# Patient Record
Sex: Male | Born: 1975 | Race: White | Hispanic: No | Marital: Single | Smoking: Current every day smoker
Health system: Southern US, Community
[De-identification: ages and names within clinical notes are randomized; demographics above are authoritative.]

## PROBLEM LIST (undated history)

## (undated) DIAGNOSIS — G8929 Other chronic pain: Secondary | ICD-10-CM

## (undated) DIAGNOSIS — R109 Unspecified abdominal pain: Secondary | ICD-10-CM

## (undated) DIAGNOSIS — N451 Epididymitis: Secondary | ICD-10-CM

## (undated) DIAGNOSIS — M549 Dorsalgia, unspecified: Secondary | ICD-10-CM

## (undated) DIAGNOSIS — E559 Vitamin D deficiency, unspecified: Secondary | ICD-10-CM

## (undated) HISTORY — PX: OTHER SURGICAL HISTORY: SHX169

---

## 1998-03-19 ENCOUNTER — Emergency Department (HOSPITAL_COMMUNITY): Admission: EM | Admit: 1998-03-19 | Discharge: 1998-03-19 | Payer: Self-pay | Admitting: Emergency Medicine

## 2003-10-01 ENCOUNTER — Emergency Department (HOSPITAL_COMMUNITY): Admission: EM | Admit: 2003-10-01 | Discharge: 2003-10-01 | Payer: Self-pay | Admitting: *Deleted

## 2004-02-20 ENCOUNTER — Emergency Department (HOSPITAL_COMMUNITY): Admission: EM | Admit: 2004-02-20 | Discharge: 2004-02-20 | Payer: Self-pay | Admitting: Emergency Medicine

## 2004-04-12 ENCOUNTER — Emergency Department (HOSPITAL_COMMUNITY): Admission: EM | Admit: 2004-04-12 | Discharge: 2004-04-12 | Payer: Self-pay | Admitting: Emergency Medicine

## 2006-02-14 ENCOUNTER — Emergency Department (HOSPITAL_COMMUNITY): Admission: EM | Admit: 2006-02-14 | Discharge: 2006-02-14 | Payer: Self-pay | Admitting: Emergency Medicine

## 2006-02-26 ENCOUNTER — Encounter (HOSPITAL_COMMUNITY): Admission: RE | Admit: 2006-02-26 | Discharge: 2006-03-28 | Payer: Self-pay | Admitting: Orthopaedic Surgery

## 2006-03-04 ENCOUNTER — Emergency Department (HOSPITAL_COMMUNITY): Admission: EM | Admit: 2006-03-04 | Discharge: 2006-03-04 | Payer: Self-pay | Admitting: Emergency Medicine

## 2006-03-26 ENCOUNTER — Emergency Department (HOSPITAL_COMMUNITY): Admission: EM | Admit: 2006-03-26 | Discharge: 2006-03-26 | Payer: Self-pay | Admitting: Emergency Medicine

## 2008-09-25 ENCOUNTER — Emergency Department (HOSPITAL_COMMUNITY): Admission: EM | Admit: 2008-09-25 | Discharge: 2008-09-26 | Payer: Self-pay | Admitting: Emergency Medicine

## 2009-10-27 ENCOUNTER — Emergency Department (HOSPITAL_COMMUNITY): Admission: EM | Admit: 2009-10-27 | Discharge: 2009-10-27 | Payer: Self-pay | Admitting: Emergency Medicine

## 2009-11-29 IMAGING — CR DG CHEST 2V
2 series · 2 of 2 positions shown · non-contrast
Comparison: None.

CLINICAL DATA: Rash.  Cough.  Congestion.

CHEST - 2 VIEW

[view not recorded (1 of 2)]
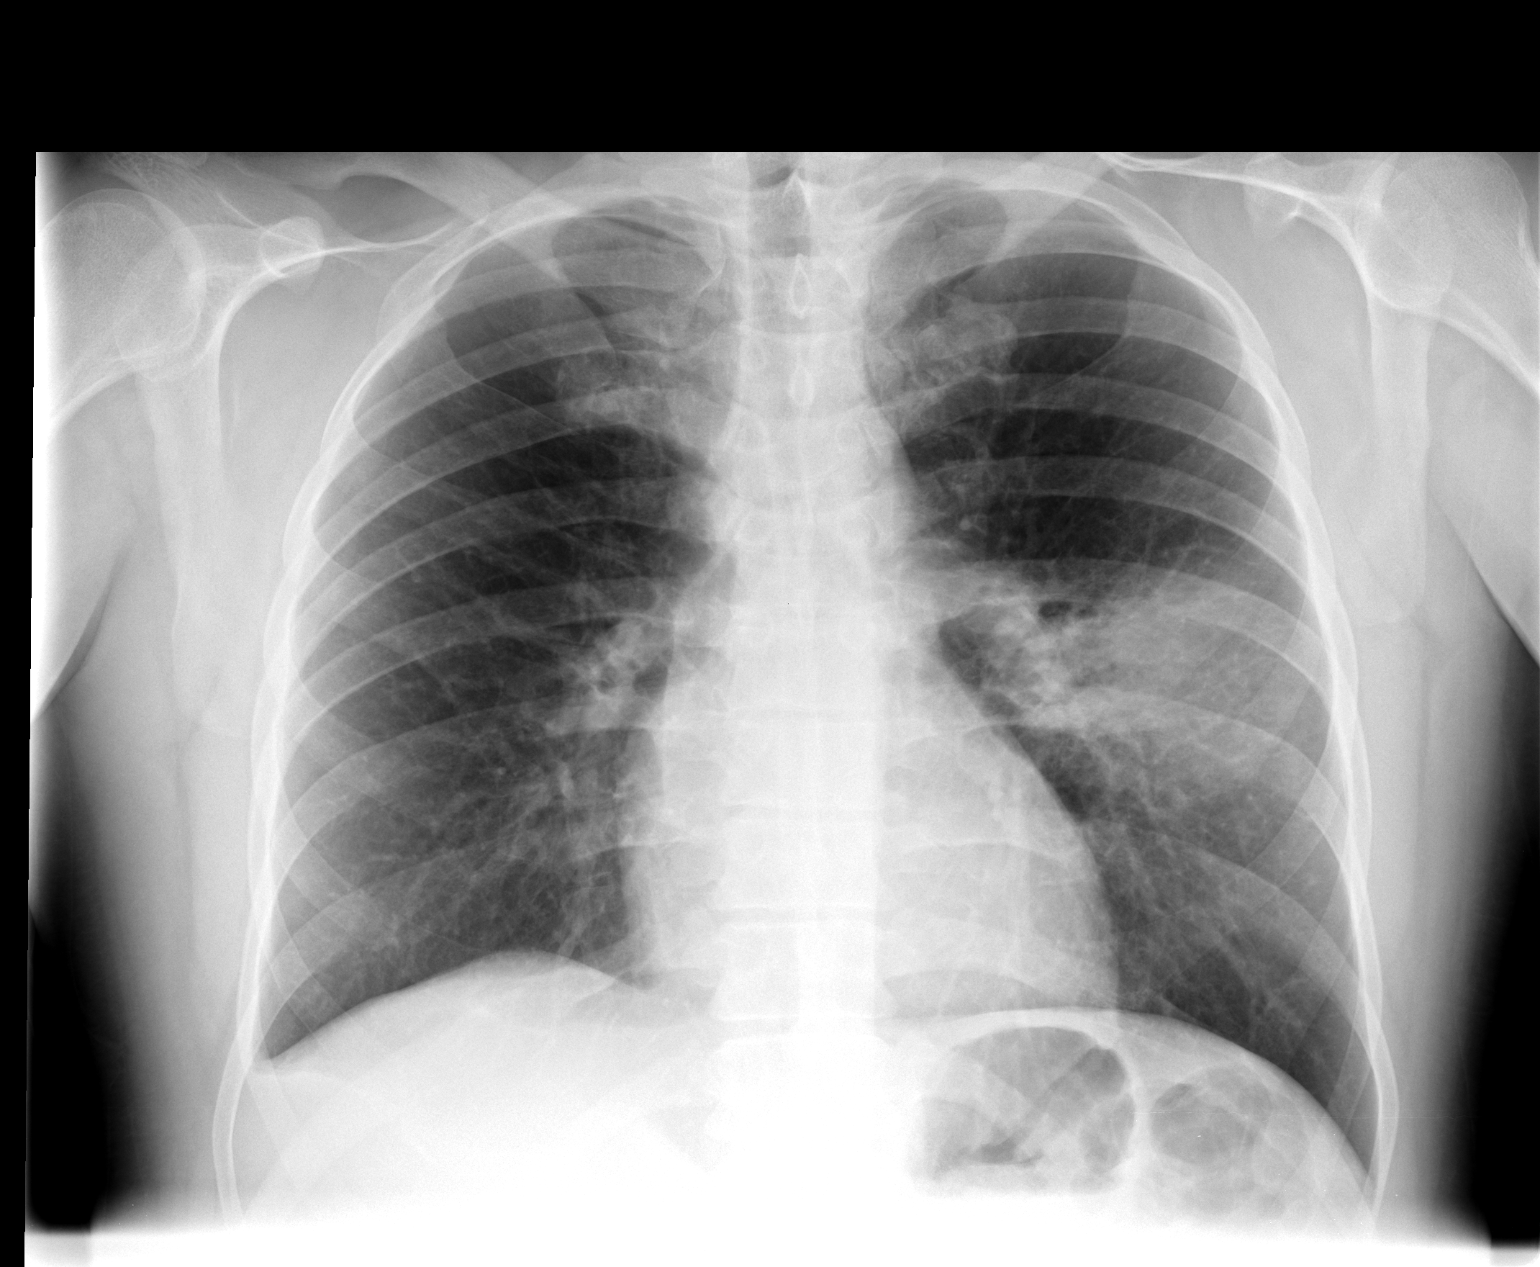

[view not recorded (2 of 2)]
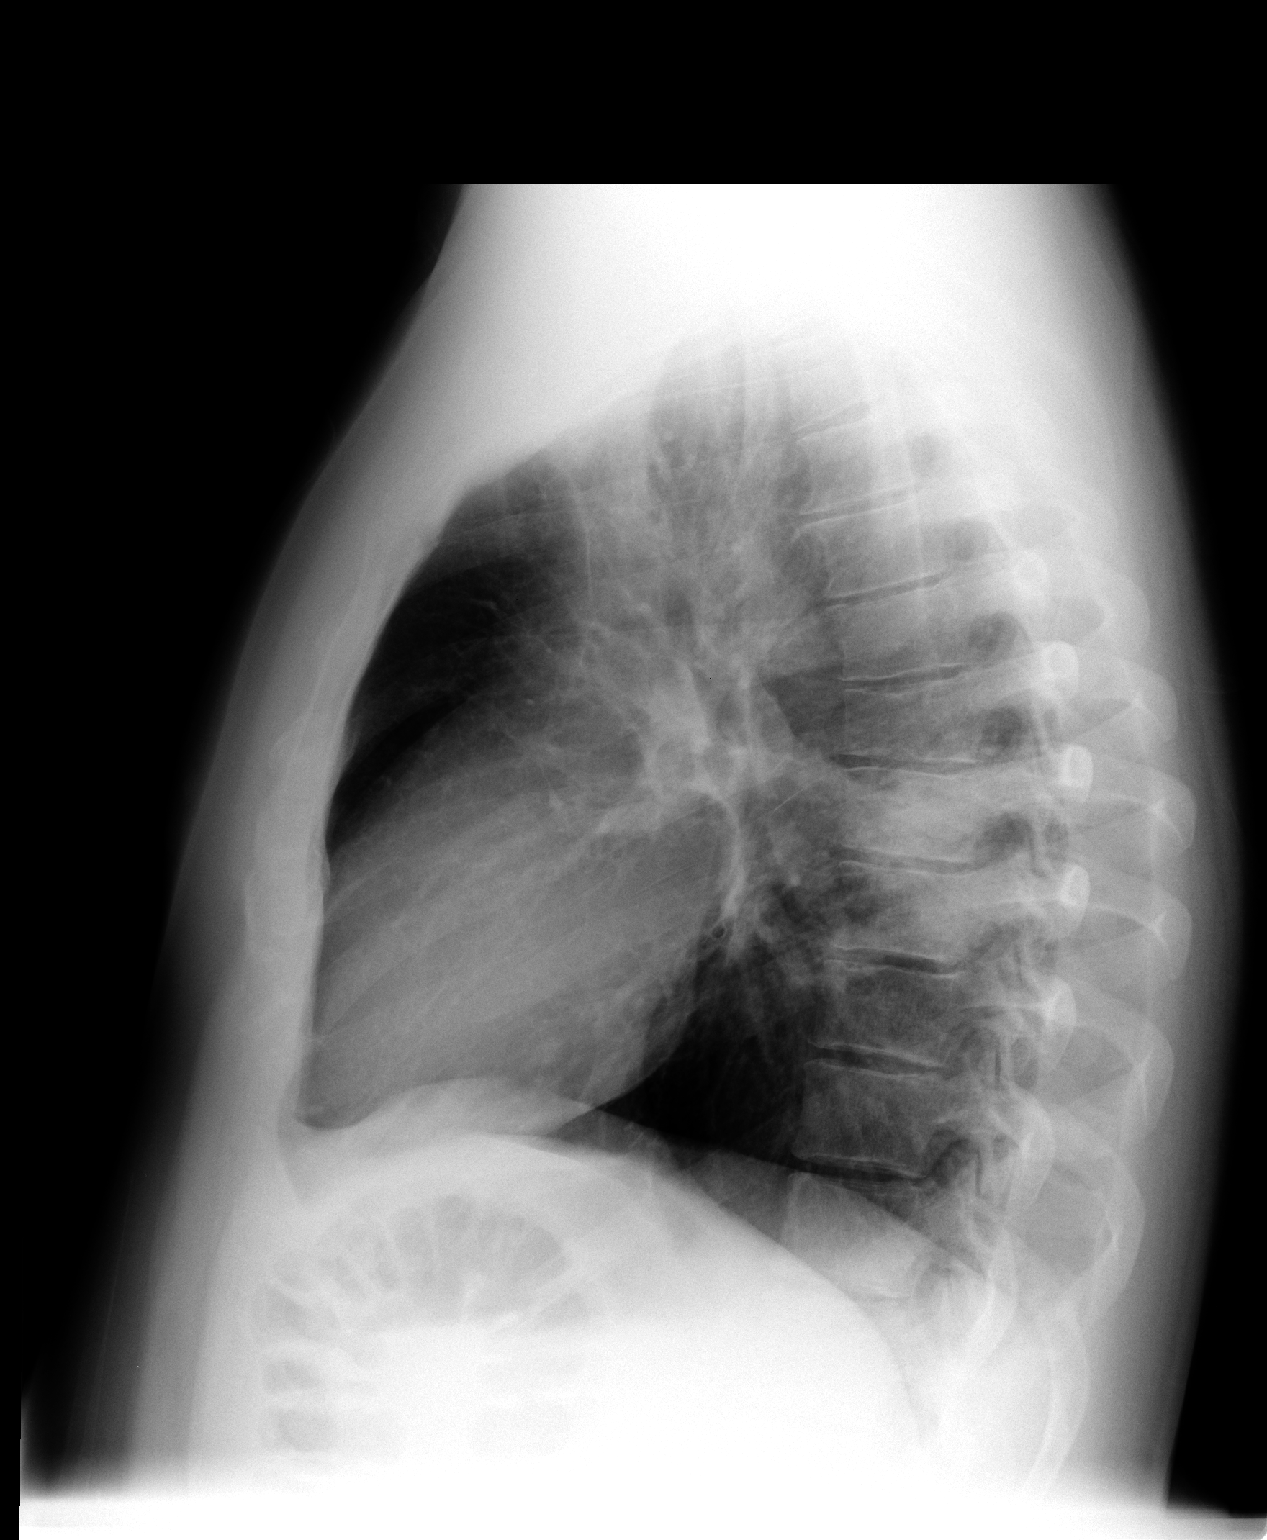

[2 of 2 positions shown; findings below may reference images not displayed]

FINDINGS: Superior segment left lower lobe pneumonia is present.
Cardiopericardial silhouette appears within normal limits.  No
pleural effusion.  No plain film evidence of adenopathy. Follow-up
to ensure radiographic clearing recommended.  Clearing is usually
observed at 4-6 weeks.
IMPRESSION: Superior segment left lower lobe pneumonia.

## 2009-11-30 ENCOUNTER — Emergency Department (HOSPITAL_COMMUNITY): Admission: EM | Admit: 2009-11-30 | Discharge: 2009-11-30 | Payer: Self-pay | Admitting: Emergency Medicine

## 2010-01-10 ENCOUNTER — Emergency Department (HOSPITAL_COMMUNITY): Admission: EM | Admit: 2010-01-10 | Discharge: 2009-11-29 | Payer: Self-pay | Admitting: Emergency Medicine

## 2010-01-30 ENCOUNTER — Emergency Department (HOSPITAL_COMMUNITY)
Admission: EM | Admit: 2010-01-30 | Discharge: 2010-01-30 | Payer: Self-pay | Source: Home / Self Care | Admitting: Emergency Medicine

## 2010-12-04 ENCOUNTER — Emergency Department (HOSPITAL_COMMUNITY): Payer: Self-pay

## 2010-12-04 ENCOUNTER — Encounter: Payer: Self-pay | Admitting: *Deleted

## 2010-12-04 ENCOUNTER — Emergency Department (HOSPITAL_COMMUNITY)
Admission: EM | Admit: 2010-12-04 | Discharge: 2010-12-04 | Disposition: A | Discharge status: Home / Self Care | Payer: Self-pay | Attending: Emergency Medicine | Admitting: Emergency Medicine

## 2010-12-04 DIAGNOSIS — Z87891 Personal history of nicotine dependence: Secondary | ICD-10-CM | POA: Insufficient documentation

## 2010-12-04 DIAGNOSIS — J40 Bronchitis, not specified as acute or chronic: Secondary | ICD-10-CM | POA: Insufficient documentation

## 2010-12-04 DIAGNOSIS — M549 Dorsalgia, unspecified: Secondary | ICD-10-CM | POA: Insufficient documentation

## 2010-12-04 DIAGNOSIS — J329 Chronic sinusitis, unspecified: Secondary | ICD-10-CM | POA: Insufficient documentation

## 2010-12-04 DIAGNOSIS — W11XXXA Fall on and from ladder, initial encounter: Secondary | ICD-10-CM | POA: Insufficient documentation

## 2010-12-04 MED ORDER — PREDNISONE 50 MG PO TABS: 50.0000 mg | ORAL_TABLET | Freq: Every day | ORAL | Status: AC

## 2010-12-04 MED ORDER — HYDROCOD POLST-CHLORPHEN POLST 10-8 MG/5ML PO LQCR: 5.0000 mL | Freq: Two times a day (BID) | ORAL | Status: DC

## 2010-12-04 MED ORDER — AZITHROMYCIN 250 MG PO TABS: 250.0000 mg | ORAL_TABLET | Freq: Every day | ORAL | Status: AC

## 2010-12-04 MED ORDER — ACETAMINOPHEN 500 MG PO TABS
1000.0000 mg | ORAL_TABLET | Freq: Once | ORAL | Status: AC
  Administered 2010-12-04: 1000 mg via ORAL
  Filled 2010-12-04: qty 2

## 2010-12-04 NOTE — ED Notes (Signed)
Reports productive cough, congestion.  Also reporting occasional SOB, and back pain.

## 2010-12-04 NOTE — ED Provider Notes (Signed)
Scribed for Donnetta Hutching, MD, the patient was seen in room APA08/APA08. This chart was scribed by [employer]. The patient's care started at 21:17  CSN: [id number] Arrival date & time: 12/04/2010  8:53 PM   First MD Initiated Contact with Patient 12/04/10 2117      Chief Complaint  Patient presents with  . Cough   HPI FARAAZ WOLIN is a 35 y.o. male who presents to the Emergency Department complaining of Cough. Patient reports feeling like he has pneumonia. He also reports that he was coming down a ladder when he fell and hit his back. Reports he has since felt like "electricity is going through his hands". Reports tingling sensation when he coughs. Patient is a former smoker    History reviewed. No pertinent past medical history.  History reviewed. No pertinent past surgical history.  History reviewed. No pertinent family history.  History  Substance Use Topics  . Smoking status: Former Games developer  . Smokeless tobacco: Not on file  . Alcohol Use: No      Review of Systems  Musculoskeletal: Positive for back pain.  All other systems reviewed and are negative.    Allergies  Sulfa antibiotics and Tramadol  Home Medications   Current Outpatient Rx  Name Route Sig Dispense Refill  . DM-PHENYLEPHRINE-ACETAMINOPHEN 10-5-325 MG/15ML PO LIQD Oral Take 10 mLs by mouth at bedtime as needed. For cold symptoms     . GUAIFENESIN 400 MG PO TABS Oral Take 400 mg by mouth every 4 (four) hours.      Marland Kitchen PHENYLEPHRINE-DM-GG 5-10-200 MG/5ML PO LIQD Oral Take 5-10 mLs by mouth as needed. For cold symptoms       BP 123/67  Pulse 70  Temp(Src) 98.6 F (37 C) (Oral)  Resp 19  SpO2 99%  Physical Exam  Nursing note and vitals reviewed. Constitutional: He appears well-developed and well-nourished. No distress.  HENT:  Head: Normocephalic and atraumatic.  Right Ear: External ear normal.  Left Ear: External ear normal.       Minimal maxillary sinus tenderness  Eyes: Conjunctivae are  normal. Right eye exhibits no discharge. Left eye exhibits no discharge. No scleral icterus.  Neck: Neck supple. No tracheal deviation present.  Cardiovascular: Normal rate, regular rhythm and intact distal pulses.   Pulmonary/Chest: Effort normal and breath sounds normal. No stridor. No respiratory distress. He has no wheezes. He has no rales.  Abdominal: Soft. Bowel sounds are normal. He exhibits no distension. There is no tenderness. There is no rebound and no guarding.  Musculoskeletal: He exhibits tenderness (Tenderness at T10). He exhibits no edema.  Neurological: He is alert. He has normal strength. No sensory deficit. Cranial nerve deficit:  no gross defecits noted. He exhibits normal muscle tone. He displays no seizure activity. Coordination normal.  Skin: Skin is warm and dry. No rash noted.  Psychiatric: He has a normal mood and affect.    ED Course  Procedures   DIAGNOSTIC STUDIES: Oxygen Saturation is 99% on room air, normal by my interpretation.    COORDINATION OF CARE: 21:35 - EDP examined patient and discussed treatment plan and options with him. Patient to be put on some Prednisone, antibiotics and prescription cough syrup. Patient is aware of treatment plan and is in agreement.   Dg Chest 2 View  12/04/2010  *RADIOLOGY REPORT*  Clinical Data: Cough and wheeze for several days.  CHEST - 2 VIEW  Comparison: 11/29/2009  Findings: Previously demonstrated left lower lung pneumonia has resolved. The heart  size and pulmonary vascularity are normal. The lungs appear clear and expanded without focal air space disease or consolidation. No blunting of the costophrenic angles. Degenerative changes in the spine.  IMPRESSION: No evidence of active pulmonary disease.  Original Report Authenticated By: Marlon Pel, M.D.    MDM  Patient is nontoxic. Lungs are clear. There is minimal maxillary sinus tenderness. Describes shooting pain down the arms and legs while coughing. Normal  neuro exam. Rx antibiotic, prednisone, cough syrup.   I personally performed the services described in this documentation, which was scribed in my presence. The recorded information has been reviewed and considered.      Donnetta Hutching, MD 12/04/10 2238

## 2010-12-04 NOTE — ED Notes (Signed)
Attempted to place pt in room.  No response from waiting area when called.

## 2010-12-04 NOTE — ED Notes (Signed)
Patient to room 8 from waiting room. Placed in gown. States he has been having a productive cough x 3 days. Sputum yellowish brown. States he was going down a ladder two days ago, got caught in a tight space in between a piece of wood and hurt his middle back. Ever since occurrence, when patient coughs, he has "jolts of pain" that goes down bilateral arms and at times bilateral legs. Pain only occurs when coughing.

## 2010-12-04 NOTE — ED Notes (Signed)
Patient back to room from radiology. Denies any needs at this time. Watching tv. Call bell within reach. Will continue to monitor.

## 2010-12-04 NOTE — ED Notes (Signed)
Into room to see patient. In no distress. Equal chest rise and fall, regular. States he is having 6/10 headache and would like something for the pain. Denies any other needs. Call bell within reach. MD aware. Will continue to monitor.

## 2011-01-09 ENCOUNTER — Emergency Department (HOSPITAL_COMMUNITY): Payer: Self-pay

## 2011-01-09 ENCOUNTER — Encounter (HOSPITAL_COMMUNITY): Payer: Self-pay | Admitting: *Deleted

## 2011-01-09 ENCOUNTER — Emergency Department (HOSPITAL_COMMUNITY)
Admission: EM | Admit: 2011-01-09 | Discharge: 2011-01-09 | Disposition: A | Discharge status: Home / Self Care | Payer: Self-pay | Attending: Emergency Medicine | Admitting: Emergency Medicine

## 2011-01-09 DIAGNOSIS — Y92009 Unspecified place in unspecified non-institutional (private) residence as the place of occurrence of the external cause: Secondary | ICD-10-CM | POA: Insufficient documentation

## 2011-01-09 DIAGNOSIS — M5431 Sciatica, right side: Secondary | ICD-10-CM

## 2011-01-09 DIAGNOSIS — S60940A Unspecified superficial injury of right index finger, initial encounter: Secondary | ICD-10-CM

## 2011-01-09 DIAGNOSIS — M5430 Sciatica, unspecified side: Secondary | ICD-10-CM | POA: Insufficient documentation

## 2011-01-09 DIAGNOSIS — R350 Frequency of micturition: Secondary | ICD-10-CM | POA: Insufficient documentation

## 2011-01-09 DIAGNOSIS — R209 Unspecified disturbances of skin sensation: Secondary | ICD-10-CM | POA: Insufficient documentation

## 2011-01-09 DIAGNOSIS — R5381 Other malaise: Secondary | ICD-10-CM | POA: Insufficient documentation

## 2011-01-09 DIAGNOSIS — W230XXA Caught, crushed, jammed, or pinched between moving objects, initial encounter: Secondary | ICD-10-CM | POA: Insufficient documentation

## 2011-01-09 DIAGNOSIS — Z23 Encounter for immunization: Secondary | ICD-10-CM | POA: Insufficient documentation

## 2011-01-09 DIAGNOSIS — Z87891 Personal history of nicotine dependence: Secondary | ICD-10-CM | POA: Insufficient documentation

## 2011-01-09 DIAGNOSIS — S60949A Unspecified superficial injury of unspecified finger, initial encounter: Secondary | ICD-10-CM | POA: Insufficient documentation

## 2011-01-09 HISTORY — DX: Dorsalgia, unspecified: M54.9

## 2011-01-09 HISTORY — DX: Other chronic pain: G89.29

## 2011-01-09 MED ORDER — CEPHALEXIN 500 MG PO CAPS
500.0000 mg | ORAL_CAPSULE | Freq: Once | ORAL | Status: AC
  Administered 2011-01-09: 500 mg via ORAL
  Filled 2011-01-09: qty 1

## 2011-01-09 MED ORDER — TETANUS-DIPHTH-ACELL PERTUSSIS 5-2.5-18.5 LF-MCG/0.5 IM SUSP
0.5000 mL | Freq: Once | INTRAMUSCULAR | Status: AC
  Administered 2011-01-09: 0.5 mL via INTRAMUSCULAR
  Filled 2011-01-09 (×2): qty 0.5

## 2011-01-09 MED ORDER — CEPHALEXIN 500 MG PO CAPS: 500.0000 mg | ORAL_CAPSULE | Freq: Four times a day (QID) | ORAL | Status: AC

## 2011-01-09 MED ORDER — HYDROCODONE-ACETAMINOPHEN 5-325 MG PO TABS
1.0000 | ORAL_TABLET | Freq: Once | ORAL | Status: AC
  Administered 2011-01-09: 1 via ORAL
  Filled 2011-01-09: qty 1

## 2011-01-09 MED ORDER — OXYCODONE-ACETAMINOPHEN 5-325 MG PO TABS: 1.0000 | ORAL_TABLET | ORAL | Status: AC | PRN

## 2011-01-09 NOTE — ED Provider Notes (Signed)
I have personally seen and examined the patient.  I have discussed the plan of care with the resident.  I have reviewed the documentation on PMH/FH/Soc. History.  I have reviewed the documentation of the resident and agree except, no clonus on my exam.  Pt is ambulatory.  Equal reflexes in bilateral LE.  Normal rectal tone.  No saddle anesthesia.  Denies recent urinary incontinence/fecal incontinence Doubt cauda equina at this time.  Needs spine followup   Joya Gaskins, MD 01/09/11 2031

## 2011-01-09 NOTE — ED Notes (Signed)
Pt c/o right leg pain; pt states he has a pinched nerve; pt also c/o left forefinger pain after getting it caught in a grinder; pt is missing part of finger nail; bleeding controlled at this time

## 2011-01-09 NOTE — ED Notes (Signed)
Pt reports pain in his right leg and hips for about 2 months that feels like a tingling and numbness. Came to ED today with left index finger injury. States he was grinding some metal for work and got his finger caught in the grinder. Bleeding is controlled at this time. No acute distress noted.

## 2011-01-09 NOTE — ED Provider Notes (Signed)
History     CSN: [id number] Arrival date & time: 01/09/2011  5:30 PM   First MD Initiated Contact with Patient 01/09/11 1740      Chief Complaint  Patient presents with  . Leg Pain    right leg  . Hand Pain    left forefinger   HPI Pt is a 35 year old male who presents today after catching his left index finger in a grinder.  He reports doing this at work today.  He cleaned it afterwards and has been applying pressure with a cold cloth since then.  Minimal bleeding.  Has a fair amount of pain and throbbing in the finger but no swelling and is able to move it.  Pt also reports worsening back and leg pain, especially in his right leg.  He has had back pain for years after having broken his back, but it has worsened in the last two months.  He is experiencing significant weakness in the right leg, shooting pains down this leg, and intermittent bladder incontinence.  He has numbness in the right leg and has a hard time walking, both due to pain and due to weakness.  Past Medical History  Diagnosis Date  . Chronic back pain     History reviewed. No pertinent past surgical history.  History reviewed. No pertinent family history.  History  Substance Use Topics  . Smoking status: Former Games developer  . Smokeless tobacco: Not on file  . Alcohol Use: No      Review of Systems  HENT: Negative.   Eyes: Negative.   Respiratory: Negative.   Cardiovascular: Negative.   Gastrointestinal: Negative.   Genitourinary: Positive for frequency and difficulty urinating. Negative for dysuria, flank pain and enuresis.  Musculoskeletal: Positive for gait problem. Negative for myalgias, back pain, joint swelling and arthralgias.       Finger per HPI  Skin: Negative.   Neurological: Positive for weakness and numbness. Negative for dizziness, light-headedness and headaches.  Hematological: Negative.     Allergies  Sulfa antibiotics and Tramadol  Home Medications   Current Outpatient Rx  Name  Route Sig Dispense Refill  . HYDROCOD POLST-CHLORPHEN POLST 10-8 MG/5ML PO LQCR Oral Take 5 mLs by mouth every 12 (twelve) hours. 120 mL 0  . DM-PHENYLEPHRINE-ACETAMINOPHEN 10-5-325 MG/15ML PO LIQD Oral Take 10 mLs by mouth at bedtime as needed. For cold symptoms     . GUAIFENESIN 400 MG PO TABS Oral Take 400 mg by mouth every 4 (four) hours.      Marland Kitchen PHENYLEPHRINE-DM-GG 5-10-200 MG/5ML PO LIQD Oral Take 5-10 mLs by mouth as needed. For cold symptoms       BP 113/55  Pulse 70  Temp(Src) 98.2 F (36.8 C) (Oral)  Resp 18  Ht 5\' 10"  (1.778 m)  Wt 190 lb (86.183 kg)  BMI 27.26 kg/m2  SpO2 98%  Physical Exam  Constitutional: He is oriented to person, place, and time. He appears well-developed and well-nourished.  HENT:  Head: Normocephalic and atraumatic.  Eyes: Conjunctivae and EOM are normal.  Neck: Normal range of motion. Neck supple.  Cardiovascular: Normal rate, regular rhythm and normal heart sounds.   Pulmonary/Chest: Effort normal and breath sounds normal.  Abdominal: Soft. He exhibits no distension. There is no tenderness.  Musculoskeletal:       Pt has left index finger injury.  Grinder has partialy removed the left nail with some injury to the nail bed but no bone or tendon is visible.  Has full  ROM, with pain, and has no active bleeding.  Small laceration over the left index DIP joint, also with no bleeding, no bone showing, and no tendon visible.  Pt has 4+ strength in the right lower extremity. Is able to stand on heels and has some ability to rise on toes bilaterally.  Neurological: He is alert and oriented to person, place, and time. No cranial nerve deficit.       Pt has 2-3 beat clonus in both LE, increased pateller reflexes bilaterally, and numbness on the anterior shin and top of foot on the right.  Skin: Skin is warm and dry.    ED Course  Procedures Finger irrigated with sterile water and cleaned with betadine.  Pt tolerated well.  Labs Reviewed - No data to  display No results found.   No diagnosis found.    MDM  More thorough exam with patient undressed demonstrates no clonus, brisk but symmetric bilateral lower extremity reflexes, and good rectal tone.  Will give numbers to [facility] and PMNR.  Spoke with Dr. Hilda Lias about OP f/u.  Agrees with plan.  Send out with 1 week keflex plus some pain medications.        Majel Homer, MD 01/09/11 2001

## 2011-01-22 ENCOUNTER — Encounter (HOSPITAL_COMMUNITY): Payer: Self-pay | Admitting: *Deleted

## 2011-01-22 ENCOUNTER — Emergency Department (HOSPITAL_COMMUNITY)
Admission: EM | Admit: 2011-01-22 | Discharge: 2011-01-22 | Disposition: A | Discharge status: Home / Self Care | Payer: Self-pay | Attending: Emergency Medicine | Admitting: Emergency Medicine

## 2011-01-22 ENCOUNTER — Emergency Department (HOSPITAL_COMMUNITY): Payer: Self-pay

## 2011-01-22 DIAGNOSIS — N3090 Cystitis, unspecified without hematuria: Secondary | ICD-10-CM | POA: Insufficient documentation

## 2011-01-22 DIAGNOSIS — D72829 Elevated white blood cell count, unspecified: Secondary | ICD-10-CM | POA: Insufficient documentation

## 2011-01-22 DIAGNOSIS — R319 Hematuria, unspecified: Secondary | ICD-10-CM | POA: Insufficient documentation

## 2011-01-22 DIAGNOSIS — Z87891 Personal history of nicotine dependence: Secondary | ICD-10-CM | POA: Insufficient documentation

## 2011-01-22 LAB — CBC
HCT: 46.2 % (ref 39.0–52.0)
Hemoglobin: 16 g/dL (ref 13.0–17.0)
MCH: 32.3 pg (ref 26.0–34.0)
MCV: 93.1 fL (ref 78.0–100.0)
RBC: 4.96 MIL/uL (ref 4.22–5.81)

## 2011-01-22 LAB — COMPREHENSIVE METABOLIC PANEL
BUN: 10 mg/dL (ref 6–23)
CO2: 28 mEq/L (ref 19–32)
Calcium: 10.3 mg/dL (ref 8.4–10.5)
Creatinine, Ser: 0.83 mg/dL (ref 0.50–1.35)
GFR calc Af Amer: >90 mL/min (ref >90)
GFR calc non Af Amer: >90 mL/min (ref >90)
Glucose, Bld: 89 mg/dL (ref 70–99)

## 2011-01-22 LAB — URINALYSIS, ROUTINE W REFLEX MICROSCOPIC
Bilirubin Urine: NEGATIVE
Glucose, UA: NEGATIVE mg/dL
Ketones, ur: NEGATIVE mg/dL
Leukocytes, UA: NEGATIVE
Nitrite: NEGATIVE
Specific Gravity, Urine: 1.025 (ref 1.005–1.030)
pH: 6 (ref 5.0–8.0)

## 2011-01-22 LAB — URINE MICROSCOPIC-ADD ON

## 2011-01-22 MED ORDER — HYDROCODONE-ACETAMINOPHEN 5-325 MG PO TABS: 1.0000 | ORAL_TABLET | Freq: Three times a day (TID) | ORAL | Status: AC | PRN

## 2011-01-22 MED ORDER — HYDROMORPHONE HCL PF 1 MG/ML IJ SOLN
1.0000 mg | Freq: Once | INTRAMUSCULAR | Status: AC
  Administered 2011-01-22: 1 mg via INTRAVENOUS
  Filled 2011-01-22: qty 1

## 2011-01-22 MED ORDER — CIPROFLOXACIN HCL 500 MG PO TABS: 500.0000 mg | ORAL_TABLET | Freq: Two times a day (BID) | ORAL | Status: AC

## 2011-01-22 MED ORDER — ONDANSETRON HCL 4 MG/2ML IJ SOLN
4.0000 mg | Freq: Once | INTRAMUSCULAR | Status: AC
  Administered 2011-01-22: 4 mg via INTRAVENOUS
  Filled 2011-01-22: qty 2

## 2011-01-22 MED ORDER — SODIUM CHLORIDE 0.9 % IV BOLUS (SEPSIS)
1000.0000 mL | Freq: Once | INTRAVENOUS | Status: AC
  Administered 2011-01-22: 1000 mL via INTRAVENOUS

## 2011-01-22 MED ORDER — CIPROFLOXACIN IN D5W 400 MG/200ML IV SOLN
400.0000 mg | Freq: Once | INTRAVENOUS | Status: AC
  Administered 2011-01-22: 400 mg via INTRAVENOUS
  Filled 2011-01-22: qty 200

## 2011-01-22 NOTE — ED Notes (Signed)
Pt states sever left flank pain began 30 min PTA.  ALso states possible hernia to left side/abdomen.

## 2011-01-22 NOTE — ED Notes (Signed)
Pt reports pain in flank area, "i thought it was a hernia, it has hurt me for longer than a week, it hurt too bad to strain so my wife has been giving me enemas every two days so I could have a bowel movement". Pt given a urine cup and instructed to provide urine sample. Awaiting MD assessment at this time. Also complains of nausea, not emesis noted at this time.

## 2011-01-22 NOTE — ED Notes (Signed)
MD at bedside. 

## 2011-01-22 NOTE — ED Provider Notes (Signed)
History     CSN: [id number] Arrival date & time: 01/22/2011  6:43 PM   First MD Initiated Contact with Patient 01/22/11 1913      Chief Complaint  Patient presents with  . Flank Pain    (Consider location/radiation/quality/duration/timing/severity/associated sxs/prior treatment) HPI The patient presents with left sided abdominal and flank pain. He notes that the pain began insidiously about one hour prior to presentation. Since onset has been Burning/Tightness sensation about the left flank and left abdomen. The pain is otherwise nonradiating. The patient notes that he is having a difficult time urinating, though no dysuria. No clear alleviating or exacerbating factors  Past Medical History  Diagnosis Date  . Chronic back pain    UTI in the distant past   History reviewed. No pertinent past surgical history.  No family history on file.  History  Substance Use Topics  . Smoking status: Former Games developer  . Smokeless tobacco: Not on file  . Alcohol Use: No      Review of Systems  Constitutional:       Per HPI, otherwise negative  HENT:       Per HPI, otherwise negative  Eyes: Negative.   Respiratory:       Per HPI, otherwise negative  Cardiovascular:       Per HPI, otherwise negative  Gastrointestinal: Negative for vomiting.  Genitourinary: Negative.   Musculoskeletal:       Per HPI, otherwise negative  Skin: Negative.   Neurological: Negative for syncope.    Allergies  Sulfa antibiotics and Tramadol  Home Medications   Current Outpatient Rx  Name Route Sig Dispense Refill  . CEPHALEXIN 500 MG PO CAPS Oral Take 500 mg by mouth 4 (four) times daily.      . OXYCODONE-ACETAMINOPHEN 5-325 MG PO TABS Oral Take 1 tablet by mouth every 4 (four) hours as needed. For pain       BP 142/49  Pulse 80  Temp(Src) 98.2 F (36.8 C) (Oral)  Resp 20  Ht 5\' 10"  (1.778 m)  Wt 190 lb (86.183 kg)  BMI 27.26 kg/m2  SpO2 100%  Physical Exam  Nursing note and vitals  reviewed. Constitutional: He is oriented to person, place, and time. He appears well-developed and well-nourished.  HENT:  Head: Normocephalic and atraumatic.  Eyes: Conjunctivae and EOM are normal.  Cardiovascular: Normal rate and regular rhythm.   Pulmonary/Chest: No stridor. No respiratory distress.  Abdominal: Soft. There is no hepatosplenomegaly. There is tenderness. There is CVA tenderness.       No appreciable fascia defect in the left lower quadrant or periumbilical regions  Musculoskeletal: He exhibits no edema.  Neurological: He is alert and oriented to person, place, and time.  Skin: Skin is warm and dry.  Psychiatric: He has a normal mood and affect.    ED Course  Procedures (including critical care time)  Labs Reviewed  URINALYSIS, ROUTINE W REFLEX MICROSCOPIC - Abnormal; Notable for the following:    Hgb urine dipstick TRACE (*)    All other components within normal limits  URINE MICROSCOPIC-ADD ON  CBC  COMPREHENSIVE METABOLIC PANEL   No results found.   No diagnosis found.  9:34 PM The patient notes significant decrease in his pain.  Results d/w the patient. Who notes that he has access to PMD, and endorses understanding of return precautions.  MDM  This generally well 35 year old male presents with new left flank pain. On exam the patient is a comfortable though in no distress.  The patient's vital signs are stable. Patient's labs are notable for mild leukocytosis, minimal hematuria. Given this second finding the patient had a CT abdomen pelvis to rule out kidney stone. CT did not demonstrate acute pathology. Given the aforementioned findings the patient was treated as though he has a urinary tract infection, noting that the evidence for this is mild, however the patient does have a history of prior UTI, which is supportive of this diagnosis. The patient's pain is more likely due to muscle strain, given the patient's endorsement of significant amount of physical  labor. Exposer return precautions, and care instructions were provided to the patient. He is stable for discharge.       Gerhard Munch, MD 01/22/11 2137

## 2011-01-22 NOTE — ED Notes (Signed)
Discharge instructions reviewed with pt, questions answered. Pt verbalized understanding.  

## 2011-01-22 NOTE — ED Notes (Signed)
Pt back from radiology. Reports "some" relief from pain.

## 2011-02-25 ENCOUNTER — Emergency Department (HOSPITAL_COMMUNITY): Payer: Self-pay

## 2011-02-25 ENCOUNTER — Emergency Department (HOSPITAL_COMMUNITY)
Admission: EM | Admit: 2011-02-25 | Discharge: 2011-02-25 | Disposition: A | Discharge status: Home / Self Care | Payer: Self-pay | Attending: Emergency Medicine | Admitting: Emergency Medicine

## 2011-02-25 ENCOUNTER — Encounter (HOSPITAL_COMMUNITY): Payer: Self-pay | Admitting: Emergency Medicine

## 2011-02-25 DIAGNOSIS — G8929 Other chronic pain: Secondary | ICD-10-CM | POA: Insufficient documentation

## 2011-02-25 DIAGNOSIS — R109 Unspecified abdominal pain: Secondary | ICD-10-CM | POA: Insufficient documentation

## 2011-02-25 DIAGNOSIS — M549 Dorsalgia, unspecified: Secondary | ICD-10-CM | POA: Insufficient documentation

## 2011-02-25 LAB — COMPREHENSIVE METABOLIC PANEL
ALT: 17 U/L (ref 0–53)
AST: 18 U/L (ref 0–37)
Albumin: 4.8 g/dL (ref 3.5–5.2)
Alkaline Phosphatase: 69 U/L (ref 39–117)
CO2: 25 mEq/L (ref 19–32)
Chloride: 101 mEq/L (ref 96–112)
Creatinine, Ser: 0.75 mg/dL (ref 0.50–1.35)
GFR calc non Af Amer: >90 mL/min (ref >90)
Potassium: 3.9 mEq/L (ref 3.5–5.1)
Sodium: 137 mEq/L (ref 135–145)
Total Bilirubin: 0.4 mg/dL (ref 0.3–1.2)

## 2011-02-25 LAB — CBC
HCT: 47 % (ref 39.0–52.0)
RDW: 12.2 % (ref 11.5–15.5)
WBC: 7.5 10*3/uL (ref 4.0–10.5)

## 2011-02-25 LAB — URINALYSIS, ROUTINE W REFLEX MICROSCOPIC
Glucose, UA: NEGATIVE mg/dL
Leukocytes, UA: NEGATIVE
Nitrite: NEGATIVE
Protein, ur: NEGATIVE mg/dL
pH: 5.5 (ref 5.0–8.0)

## 2011-02-25 LAB — URINE CULTURE: Colony Count: NO GROWTH

## 2011-02-25 LAB — DIFFERENTIAL
Basophils Absolute: 0 10*3/uL (ref 0.0–0.1)
Basophils Relative: 0 % (ref 0–1)
Lymphocytes Relative: 37 % (ref 12–46)
Monocytes Absolute: 0.4 10*3/uL (ref 0.1–1.0)
Neutro Abs: 4.2 10*3/uL (ref 1.7–7.7)
Neutrophils Relative %: 57 % (ref 43–77)

## 2011-02-25 MED ORDER — DICYCLOMINE HCL 20 MG PO TABS: 20.0000 mg | ORAL_TABLET | Freq: Four times a day (QID) | ORAL | Status: DC

## 2011-02-25 MED ORDER — ONDANSETRON HCL 4 MG/2ML IJ SOLN
4.0000 mg | INTRAMUSCULAR | Status: DC | PRN
  Administered 2011-02-25: 4 mg via INTRAVENOUS
  Filled 2011-02-25: qty 2

## 2011-02-25 MED ORDER — MORPHINE SULFATE 4 MG/ML IJ SOLN
4.0000 mg | INTRAMUSCULAR | Status: DC | PRN
  Administered 2011-02-25: 4 mg via INTRAVENOUS
  Filled 2011-02-25: qty 1

## 2011-02-25 MED ORDER — SODIUM CHLORIDE 0.9 % IV SOLN
INTRAVENOUS | Status: DC
  Administered 2011-02-25: 15:00:00 via INTRAVENOUS

## 2011-02-25 MED ORDER — HYDROCODONE-ACETAMINOPHEN 5-325 MG PO TABS: ORAL_TABLET | ORAL | Status: AC

## 2011-02-25 MED ORDER — IOHEXOL 300 MG/ML  SOLN
100.0000 mL | Freq: Once | INTRAMUSCULAR | Status: AC | PRN
  Administered 2011-02-25: 100 mL via INTRAVENOUS

## 2011-02-25 NOTE — ED Notes (Signed)
Pt returned from CT and requesting something to drink. Pt informed that results are not back from CT yet

## 2011-02-25 NOTE — ED Notes (Signed)
Pt leaving for CT at this time.  

## 2011-02-25 NOTE — ED Notes (Signed)
Pt completed water from CT.

## 2011-02-25 NOTE — ED Provider Notes (Signed)
History     CSN: [id number]  Arrival date & time 02/25/11  1408    Chief Complaint  Patient presents with  . Abdominal Pain   HPI Pt was seen at 1430.  Per pt, c/o gradual onset and persistence of acute flair of his chronic LBP for the past 5 months, worse over the past 3 months.  Pt describes his low back pain per his chronic for the past several years, has been assoc with bilat LE's "weakness."  Pt also c/o gradual onset and persistence of multiple intermittent episodes of abd "pain" for the past 2 months.  States he has to have his wife give him enemas every few days to have a BM because "straining to have a BM" causes his abd pain to increase.  Describes the abd pain as "tight."  Denies incont/retention of bowel or bladder, no focal motor weakness, no tingling/numbness in extremities, no saddle anesthesia, no CP/SOB, no rash, no fevers, no N/V/D, no dysuria/hematuria, no testicular pain/swelling, no injury.  Pt has been eval in ED last month x2 for these same symptoms, has not f/u with PMD, Ortho, or Spine as instructed.        Past Medical History  Diagnosis Date  . Chronic back pain     History reviewed. No pertinent past surgical history.   History  Substance Use Topics  . Smoking status: Former Games developer  . Smokeless tobacco: Not on file  . Alcohol Use: No    Review of Systems ROS: Statement: All systems negative except as marked or noted in the HPI; Constitutional: Negative for fever and chills. ; ; Eyes: Negative for eye pain, redness and discharge. ; ; ENMT: Negative for ear pain, hoarseness, nasal congestion, sinus pressure and sore throat. ; ; Cardiovascular: Negative for chest pain, palpitations, diaphoresis, dyspnea and peripheral edema. ; ; Respiratory: Negative for cough, wheezing and stridor. ; ; Gastrointestinal: +constipation, abd pain. Negative for nausea, vomiting, diarrhea, blood in stool, hematemesis, jaundice and rectal bleeding. . ; ; Genitourinary: Negative for  dysuria, flank pain and hematuria. ; Genital:  No penile drainage or rash, no testicular pain or swelling, no scrotal rash or swelling.;; Musculoskeletal: +LBP.  Negative for neck pain. Negative for swelling and trauma.; ; Skin: Negative for pruritus, rash, abrasions, blisters, bruising and skin lesion.; ; Neuro: Negative for headache, lightheadedness and neck stiffness. +weakness. Negative for altered level of consciousness , altered mental status, extremity weakness, paresthesias, involuntary movement, seizure and syncope.    Allergies  Sulfa antibiotics and Tramadol  Home Medications   Current Outpatient Rx  Name Route Sig Dispense Refill  . CEPHALEXIN 500 MG PO CAPS Oral Take 500 mg by mouth 4 (four) times daily.      . OXYCODONE-ACETAMINOPHEN 5-325 MG PO TABS Oral Take 1 tablet by mouth every 4 (four) hours as needed. For pain       BP 116/73  Pulse 76  Temp(Src) 98 F (36.7 C) (Oral)  Resp 18  Ht 5\' 10"  (1.778 m)  Wt 185 lb (83.915 kg)  BMI 26.54 kg/m2  SpO2 99%  Physical Exam 1435: Physical examination:  Nursing notes reviewed; Vital signs and O2 SAT reviewed;  Constitutional: Well developed, Well nourished, Well hydrated, Anxious, uncomfortable appearing; Head:  Normocephalic, atraumatic; Eyes: EOMI, PERRL, No scleral icterus; ENMT: Mouth and pharynx normal, Mucous membranes moist; Neck: Supple, Full range of motion, No lymphadenopathy; Cardiovascular: Regular rate and rhythm, No murmur, rub, or gallop; Respiratory: Breath sounds clear & equal bilaterally,  No rales, rhonchi, wheezes, or rub, Normal respiratory effort/excursion; Chest: Nontender, Movement normal; Abdomen: Soft, +diffuse tenderness to palp, no rebound or guarding, Nondistended, Normal bowel sounds; Genitourinary: No CVA tenderness; Spine:  No midline CS, TS, LS tenderness. +TTP bilat lower lumbar hypertonic paraspinal muscles.; Extremities: Pulses normal, No tenderness, No edema, No calf edema or asymmetry.; Neuro:  AA&Ox3, Major CN grossly intact. No facial droop, speech clear. Strength 5/5 equal bilat UE's and LE's, including great toe dorsiflexion.  DTR 2/4 equal bilat UE's and LE's.  No gross sensory deficits.  Neg straight leg raises bilat.; Skin: Color normal, Warm, Dry, no rash.    ED Course  Procedures    MDM  MDM Reviewed: previous chart, nursing note and vitals Reviewed previous: x-ray and labs Interpretation: labs, CT scan and MRI   Results for orders placed during the hospital encounter of 02/25/11  URINALYSIS, ROUTINE W REFLEX MICROSCOPIC      Component Value Range   Color, Urine YELLOW  YELLOW    APPearance CLEAR  CLEAR    Specific Gravity, Urine 1.010  1.005 - 1.030    pH 5.5  5.0 - 8.0    Glucose, UA NEGATIVE  NEGATIVE (mg/dL)   Hgb urine dipstick NEGATIVE  NEGATIVE    Bilirubin Urine NEGATIVE  NEGATIVE    Ketones, ur NEGATIVE  NEGATIVE (mg/dL)   Protein, ur NEGATIVE  NEGATIVE (mg/dL)   Urobilinogen, UA 0.2  0.0 - 1.0 (mg/dL)   Nitrite NEGATIVE  NEGATIVE    Leukocytes, UA NEGATIVE  NEGATIVE   CBC      Component Value Range   WBC 7.5  4.0 - 10.5 (K/uL)   RBC 5.21  4.22 - 5.81 (MIL/uL)   Hemoglobin 16.2  13.0 - 17.0 (g/dL)   HCT 40.9  81.1 - 91.4 (%)   MCV 90.2  78.0 - 100.0 (fL)   MCH 31.1  26.0 - 34.0 (pg)   MCHC 34.5  30.0 - 36.0 (g/dL)   RDW 78.2  95.6 - 21.3 (%)   Platelets 184  150 - 400 (K/uL)  DIFFERENTIAL      Component Value Range   Neutrophils Relative 57  43 - 77 (%)   Neutro Abs 4.2  1.7 - 7.7 (K/uL)   Lymphocytes Relative 37  12 - 46 (%)   Lymphs Abs 2.8  0.7 - 4.0 (K/uL)   Monocytes Relative 5  3 - 12 (%)   Monocytes Absolute 0.4  0.1 - 1.0 (K/uL)   Eosinophils Relative 1  0 - 5 (%)   Eosinophils Absolute 0.1  0.0 - 0.7 (K/uL)   Basophils Relative 0  0 - 1 (%)   Basophils Absolute 0.0  0.0 - 0.1 (K/uL)  COMPREHENSIVE METABOLIC PANEL      Component Value Range   Sodium 137  135 - 145 (mEq/L)   Potassium 3.9  3.5 - 5.1 (mEq/L)   Chloride 101  96  - 112 (mEq/L)   CO2 25  19 - 32 (mEq/L)   Glucose, Bld 90  70 - 99 (mg/dL)   BUN 7  6 - 23 (mg/dL)   Creatinine, Ser 0.86  0.50 - 1.35 (mg/dL)   Calcium 57.8  8.4 - 10.5 (mg/dL)   Total Protein 7.7  6.0 - 8.3 (g/dL)   Albumin 4.8  3.5 - 5.2 (g/dL)   AST 18  0 - 37 (U/L)   ALT 17  0 - 53 (U/L)   Alkaline Phosphatase 69  39 - 117 (U/L)  Total Bilirubin 0.4  0.3 - 1.2 (mg/dL)   GFR calc non Af Amer >90  >90 (mL/min)   GFR calc Af Amer >90  >90 (mL/min)  LIPASE, BLOOD      Component Value Range   Lipase 17  11 - 59 (U/L)   Mr Lumbar Spine Wo Contrast 02/25/2011  *RADIOLOGY REPORT*  Clinical Data: Low back pain.  MRI LUMBAR SPINE WITHOUT CONTRAST  Technique:  Multiplanar and multiecho pulse sequences of the lumbar spine were obtained without intravenous contrast.  Comparison: 02/25/2011 CT abdomen and pelvis.  Findings: Last fully open disc space is labeled L5-S1.  Present examination incorporates from T11-12 disc space through the lower sacrum.  Conus L1-2 level.  T11-12 through L2-3 unremarkable.  L3-4:  Short pedicles.  Minimal facet joint degenerative changes.  L4-5:  Short pedicles.  Minimal facet joint degenerative changes. Minimal bulge.  Very mild spinal stenosis.  L5-S1:  Mild facet joint degenerative changes.  Minimal bulge.  IMPRESSION: Minimal degenerative changes without evidence of significant spinal stenosis, foraminal narrowing or nerve root compression.  Original Report Authenticated By: Fuller Canada, M.D.   Ct Abdomen Pelvis W Contrast 02/25/2011  *RADIOLOGY REPORT*  Clinical Data: Abdominal pain, low back pain, recent UTI  CT ABDOMEN AND PELVIS WITH CONTRAST  Technique:  Multidetector CT imaging of the abdomen and pelvis was performed following the standard protocol during bolus administration of intravenous contrast. Sagittal and coronal MPR images reconstructed from axial data set.  Contrast: OMNIPAQUE IOHEXOL 300 MG/ML IV SOLN Dilute oral contrast.  Comparison:  01/22/2011  Findings: Lung bases clear. Probable focal intrahepatic fatty deposition adjacent to the falciform fissure. Liver, spleen, pancreas, kidneys, and adrenal glands normal appearance. Appendix not definitely visualized but no definite pericecal inflammatory process identified. Stomach and bowel loops normal appearance. Minimal bladder wall thickening though bladder is incompletely distended, question artifact. No definite urinary tract calcification or dilatation. No mass, adenopathy, free fluid or inflammatory process. No hernia or acute bony lesion.  IMPRESSION: No definite acute intra abdominal or intrapelvic abnormalities.  Original Report Authenticated By: Lollie Marrow, M.D.     6:49 PM:  Signif other at bedside yelling, threatening staff, beligerant, demanding to "want to know what is going on with him" and "what EXACTLY the dx is."  I informed her and the pt that his dx testing has been without acute abnl today and that this was good news.  He will need to f/u with GI to further investigate his colon issues, Spine MD for his back issues.  Pt's signif other demanding both specialists see the pt in the ED "because we don't have any money to do that stuff."  Informed that he does not need to see an emergent specialist today in the ED and that is not the way the ED functions.  Pt's signif other continues beligerant and yelling at staff, stating she will "have my name on this building if you don't do more testing."  MRI LS ordered, as part of pt's c/o is recurrent back pain and legs weakness.  MRI LS is neg for acute process at this time, specifically for cauda equina.  Pt and signif other again informed of the good news that his testing did not show a catistrophic health crisis today, and that he can go home and f/u as an outpt.  Pt's signif other continues to say she "doesn't care" and is demanding "more testing right now" because "you obviously don't know how what your job  is and how to do it."  Pt  states that "Dr. Neil Crouch on TV said a bulging disk in my neck can cause my abd pain."  Pt specifically denies neck pain, UE's tingling/numbness,  UE's focal motor weakness or neck injury.  Long d/w both regarding neck/back disk/nerve issues and the regions they affect.  Both tell me "you don't know, Dr. Neil Crouch said so."  Pt is refusing any rx for muscle relaxers stating that "it's against my religion to take those medicines because they will relax my heart so much it will stop."  Long d/w pt and signif other regarding the action of muscle relaxer medications.  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  I told him that I could not make predictions, but that his testing today in the ED did not show he needed any intervention such as an operation or admission to the hospital for any emergent/life threatening process at this time.  Pt is lounging on stretcher, watching TV, appears NAD, non-toxic appearing, resps easy, neuro assessment unchanged.  He has tol PO well and has not had vomiting or diarrhea while in ED.  VS remain stable.  I do not see at this point there is any further [facility] precluding discharge at this time. The patient appears reasonably screened and stabilized for discharge and I doubt any other medical condition or other [facility] requiring further screening, evaluation, or treatment in the ED at this time prior to discharge.          Rashena Dowling Allison Quarry, DO 03/01/11 [phone]

## 2011-02-25 NOTE — ED Notes (Signed)
Pt requesting pain medication at this time.

## 2011-02-25 NOTE — ED Notes (Signed)
Pt at MR at this time.

## 2011-02-25 NOTE — ED Notes (Signed)
Pt c/o back pain and abd pain. Pt states he was recently diagnosed with uti and is no better.

## 2011-02-25 NOTE — ED Notes (Signed)
Pt states 4mg  IV Morphine did not "touch my pain" still c/o abd pain and leg pain per pt. nad noted. Pt sitting in bed talking with nad.

## 2011-02-25 NOTE — ED Notes (Signed)
Pt drank water for CT without any difficulty. No nausea present.

## 2011-04-16 ENCOUNTER — Encounter (HOSPITAL_COMMUNITY): Payer: Self-pay | Admitting: *Deleted

## 2011-04-16 ENCOUNTER — Emergency Department (HOSPITAL_COMMUNITY): Payer: Self-pay

## 2011-04-16 ENCOUNTER — Emergency Department (HOSPITAL_COMMUNITY)
Admission: EM | Admit: 2011-04-16 | Discharge: 2011-04-16 | Disposition: A | Discharge status: Home / Self Care | Payer: Self-pay | Attending: Emergency Medicine | Admitting: Emergency Medicine

## 2011-04-16 DIAGNOSIS — F411 Generalized anxiety disorder: Secondary | ICD-10-CM | POA: Insufficient documentation

## 2011-04-16 DIAGNOSIS — M545 Low back pain, unspecified: Secondary | ICD-10-CM | POA: Insufficient documentation

## 2011-04-16 DIAGNOSIS — G8929 Other chronic pain: Secondary | ICD-10-CM | POA: Insufficient documentation

## 2011-04-16 DIAGNOSIS — R29898 Other symptoms and signs involving the musculoskeletal system: Secondary | ICD-10-CM | POA: Insufficient documentation

## 2011-04-16 DIAGNOSIS — M549 Dorsalgia, unspecified: Secondary | ICD-10-CM

## 2011-04-16 DIAGNOSIS — K5900 Constipation, unspecified: Secondary | ICD-10-CM | POA: Insufficient documentation

## 2011-04-16 MED ORDER — LORAZEPAM 2 MG/ML IJ SOLN
2.0000 mg | Freq: Once | INTRAMUSCULAR | Status: AC
  Administered 2011-04-16: 2 mg via INTRAVENOUS
  Filled 2011-04-16: qty 1

## 2011-04-16 MED ORDER — LORAZEPAM 1 MG PO TABS: 2.0000 mg | ORAL_TABLET | Freq: Four times a day (QID) | ORAL | Status: AC | PRN

## 2011-04-16 MED ORDER — SODIUM CHLORIDE 0.9 % IV SOLN
Freq: Once | INTRAVENOUS | Status: AC
  Administered 2011-04-16: 02:00:00 via INTRAVENOUS

## 2011-04-16 NOTE — ED Notes (Signed)
Pt arrived via EMS. C.o Abdominal pain x4 months. Worse in last 4 days.

## 2011-04-16 NOTE — ED Provider Notes (Signed)
History     CSN: [id number]  Arrival date & time 04/16/11  0125   None     No chief complaint on file.   (Consider location/radiation/quality/duration/timing/severity/associated sxs/prior treatment) The history is provided by the patient.   patient here complaining of chronic left-sided lower back pain x4-5 months. Was seen here back in January for the same problem. Patient had MRI as well as a CT of the abdomen both of which were negative. Patient continues to have some her symptoms which is also been associated with lower extremity weakness. No loss of bowel or bladder function. No some increased constipation. Does not take any medications for this currently. Denies any new injuries. No fever or, vomiting. Pain is worse with movement better with remaining still.  Past Medical History  Diagnosis Date  . Chronic back pain     No past surgical history on file.  No family history on file.  History  Substance Use Topics  . Smoking status: Former Games developer  . Smokeless tobacco: Not on file  . Alcohol Use: No      Review of Systems  All other systems reviewed and are negative.    Allergies  Tramadol and Sulfa antibiotics  Home Medications   Current Outpatient Rx  Name Route Sig Dispense Refill  . DICYCLOMINE HCL 20 MG PO TABS Oral Take 1 tablet (20 mg total) by mouth every 6 (six) hours. 15 tablet 0  . OXYCODONE-ACETAMINOPHEN 5-325 MG PO TABS Oral Take 1 tablet by mouth every 4 (four) hours as needed. For pain       There were no vitals taken for this visit.  Physical Exam  Nursing note and vitals reviewed. Constitutional: He is oriented to person, place, and time. He appears well-developed and well-nourished.  Non-toxic appearance. No distress.  HENT:  Head: Normocephalic and atraumatic.  Eyes: Conjunctivae, EOM and lids are normal. Pupils are equal, round, and reactive to light.  Neck: Normal range of motion. Neck supple. No tracheal deviation present. No mass  present.  Cardiovascular: Normal rate, regular rhythm and normal heart sounds.  Exam reveals no gallop.   No murmur heard. Pulmonary/Chest: Effort normal and breath sounds normal. No stridor. No respiratory distress. He has no decreased breath sounds. He has no wheezes. He has no rhonchi. He has no rales.  Abdominal: Soft. Normal appearance and bowel sounds are normal. He exhibits no distension. There is no tenderness. There is no rebound and no CVA tenderness.  Musculoskeletal: Normal range of motion. He exhibits no edema and no tenderness.  Neurological: He is alert and oriented to person, place, and time. He has normal strength. He displays normal reflexes. No cranial nerve deficit or sensory deficit. GCS eye subscore is 4. GCS verbal subscore is 5. GCS motor subscore is 6.  Reflex Scores:      Patellar reflexes are 4+ on the right side and 4+ on the left side. Skin: Skin is warm and dry. No abrasion and no rash noted.  Psychiatric: His speech is normal and behavior is normal. His mood appears anxious.    ED Course  Procedures (including critical care time)   Labs Reviewed  URINALYSIS, ROUTINE W REFLEX MICROSCOPIC  URINE CULTURE  CBC  DIFFERENTIAL  BASIC METABOLIC PANEL   No results found.   No diagnosis found.    MDM  Patients old records reviewed and pt had an mri and abd ct in [place]. Of 2013 for the same sx and the w/u was neg--no evidence  of cauda equina, neuro is intact--pt given ativan and he feels much better, pain and spasms resolved--will give rx for same        Toy Baker, MD 04/16/11 [phone]

## 2011-04-16 NOTE — ED Notes (Signed)
A&ox4; in no distress; instructions and prescriptions reviewed-verbalizes understanding. 

## 2011-04-16 NOTE — Discharge Instructions (Signed)
Chronic Back Pain When back pain lasts longer than 3 months, it is called chronic back pain.This pain can be frustrating, but the cause of the pain is rarely dangerous.People with chronic back pain often go through certain periods that are more intense (flare-ups). CAUSES Chronic back pain can be caused by wear and tear (degeneration) on different structures in your back. These structures may include bones, ligaments, or discs. This degeneration may result in more pressure being placed on the nerves that travel to your legs and feet. This can lead to pain traveling from the low back down the back of the legs. When pain lasts longer than 3 months, it is not unusual for people to experience anxiety or depression. Anxiety and depression can also contribute to low back pain. TREATMENT  Establish a regular exercise plan. This is critical to improving your functional level.   Have a self-management plan for when you flare-up. Flare-ups rarely require a medical visit. Regular exercise will help reduce the intensity and frequency of your flare-ups.   Manage how you feel about your back pain and the rest of your life. Anxiety, depression, and feeling that you cannot alter your back pain have been shown to make back pain more intense and debilitating.   Medicines should never be your only treatment. They should be used along with other treatments to help you return to a more active lifestyle.   Procedures such as injections or surgery may be helpful but are rarely necessary. You may be able to get the same results with physical therapy or chiropractic care.  HOME CARE INSTRUCTIONS  Avoid bending, heavy lifting, prolonged sitting, and activities which make the problem worse.   Continue normal activity as much as possible.   Take brief periods of rest throughout the day to reduce your pain during flare-ups.   Follow your back exercise rehabilitation program. This can help reduce symptoms and prevent  more pain.   Only take over-the-counter or prescription medicines as directed by your caregiver. Muscle relaxants are sometimes prescribed. Narcotic pain medicine is discouraged for long-term pain, since addiction is a possible outcome.   If you smoke, quit.   Eat healthy foods and maintain a recommended body weight.  SEEK IMMEDIATE MEDICAL CARE IF:   You have weakness or numbness in one of your legs or feet.   You have trouble controlling your bladder or bowels.   You develop nausea, vomiting, abdominal pain, shortness of breath, or fainting.  Document Released: 02/28/2004 Document Revised: 01/09/2011 Document Reviewed: 01/04/2011 [employer] Patient Information 2012 [employer]. 

## 2011-04-16 NOTE — ED Notes (Signed)
C/o left-sided abd pain x 4 months, worse x 4 days; pt states he has been seen in this ED multiple times for same; reports unable to f/u with PCP as instructed or a specialist d/t limited funds. A&ox4; answers questions appropriately; anxious.

## 2011-05-07 ENCOUNTER — Ambulatory Visit (INDEPENDENT_AMBULATORY_CARE_PROVIDER_SITE_OTHER): Payer: Self-pay | Admitting: Gastroenterology

## 2011-05-07 ENCOUNTER — Encounter: Payer: Self-pay | Admitting: Gastroenterology

## 2011-05-07 DIAGNOSIS — R5381 Other malaise: Secondary | ICD-10-CM

## 2011-05-07 DIAGNOSIS — R109 Unspecified abdominal pain: Secondary | ICD-10-CM

## 2011-05-07 DIAGNOSIS — M549 Dorsalgia, unspecified: Secondary | ICD-10-CM

## 2011-05-07 DIAGNOSIS — R531 Weakness: Secondary | ICD-10-CM

## 2011-05-07 NOTE — Progress Notes (Signed)
Referring Provider: Kizzie Schwartz, Christian Searing, MD Primary Care Physician:  Christian Schwartz, Christian Searing, MD Primary Gastroenterologist:  Dr. Darrick Penna   Chief Complaint  Patient presents with  . Abdominal Pain    hunting in back/?IBS    HPI:   Christian Schwartz is a 36 year old male who presents today at the request of Christian Furnish, PA secondary to abdominal pain, constipation, weight loss. He has been to the ED 4 times since Dec 2012 with various complaints to include back pain, worsening weakness, constipation. At one point, he had been referred to [facility], but he has not gone yet. He also was referred to [facility] to Neuro Specialists. Set up for May 3.   His complaints are numerous. Hx of chronic back pain due to "breaking back" in past. No surgeries. Complains of bilateral lower flank "swelling", states numb on outside, burning on inside. Complains of hip numbness, pain. Reports decreased physical activity and worsening weakness to the point he can barely take a bath by himself. Girlfriend left him recently.   Reports left sided abdominal pain X 4 mos, constant. States Ativan helped with "spasms". +constipation. States girlfriend had been giving him enemas because he felt like he couldn't push. Denies rectal bleeding. Difficulty urinating. Denies GERD, dysphagia. States was 210 several months ago. Notes intermittent nausea, not related to anything. Good appetite.   Feels like blood flow in groin is stopped up, prolonged erections. Taking care of son who is 35, mom is 2.   Movement makes abdominal pain worse. Was working out, had job, now uses walker. Numbness right anterior thigh worse than left thigh, extending down to mid thigh.      MRI lumbar spine Jan 2013IMPRESSION: Minimal degenerative changes without evidence of significant spinal stenosis, foraminal narrowing or nerve root compression.  CT Jan 2013:  Findings: Lung bases clear. Probable focal intrahepatic  fatty deposition adjacent to the  falciform fissure. Liver, spleen, pancreas, kidneys, and adrenal glands normal appearance. Appendix not definitely visualized but no definite pericecal inflammatory process identified. Stomach and bowel loops normal appearance. Minimal bladder wall thickening though bladder is incompletely distended, question artifact. No definite urinary tract calcification or dilatation.  No mass, adenopathy, free fluid or inflammatory process. No hernia or acute bony lesion.  IMPRESSION:  No definite acute intra abdominal or intrapelvic abnormalities.  CT 01/2011: IMPRESSION: No hydronephrosis or urinary tract calculi.  CBC, CMP, Lipase normal Jan 2013  ED visits: March 2013: lower extremity weakness, constipation Jan 2013: recurrent back pain, leg weakness, MRI negative, negative cauda equina, +constipation Jan 22, 2011: left flank pain, treated for UTI, CT negative for acute pathology Jan 09, 2011: back pain, left forefinger  Past Medical History  Diagnosis Date  . Chronic back pain     hx of breaking back at age 33 and 5 years ago fell through roof    Past Surgical History  Procedure Date  . None     Current Outpatient Prescriptions  Medication Sig Dispense Refill  . dicyclomine (BENTYL) 20 MG tablet Take 1 tablet (20 mg total) by mouth every 6 (six) hours.  15 tablet  0  . LORazepam (ATIVAN) 1 MG tablet Take 1 mg by mouth every 8 (eight) hours.      Marland Kitchen oxyCODONE-acetaminophen (PERCOCET) 5-325 MG per tablet Take 1 tablet by mouth every 4 (four) hours as needed. For pain         Allergies as of 05/07/2011 - Review Complete 05/07/2011  Allergen Reaction Noted  .  Tramadol Anaphylaxis and Itching 12/04/2010  . Sulfa antibiotics Other (See Comments) 12/04/2010    Family History  Problem Relation Age of Onset  . Colon cancer      adopted, unsure    History   Social History  . Marital Status: Single    Spouse Name: N/A    Number of Children: 1  .  Years of Education: N/A   Occupational History  .     Social History Main Topics  . Smoking status: Current Everyday Smoker -- 0.5 packs/day    Types: Cigarettes  . Smokeless tobacco: Not on file  . Alcohol Use: Yes     former (since 5 years) margarita here and there  . 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 Sexually Active: Not on file   Other Topics Concern  . Not on file   Social History Narrative  . No narrative on file    Review of Systems: Gen: SEE HPI CV: Denies chest pain, heart palpitations, syncope, peripheral edema. Resp: Denies shortness of breath with rest, cough, wheezing GI: Denies dysphagia or odynophagia. Denies hematemesis, fecal incontinence, or jaundice.  GU : + difficulty starting urinating MS: SEE HPI, numerous complaints  Derm: Denies rash, itching, dry skin Psych: Denies depression, anxiety, confusion or memory loss  Heme: Denies bruising, bleeding, and enlarged lymph nodes.  Physical Exam: BP 114/66  Pulse 66  Temp(Src) 98.1 F (36.7 C) (Temporal)  Ht 5\' 10"  (1.778 m)  Wt 173 lb 12.8 oz (78.835 kg)  BMI 24.94 kg/m2 General:   Alert and oriented. Well-developed, well-nourished, anxious Head:  Normocephalic and atraumatic. Eyes:  Conjunctiva pink, sclera clear, no icterus.    Ears:  Normal auditory acuity. Nose:  No deformity, discharge,  or lesions. Mouth:  No deformity or lesions, mucosa pink and moist.  Neck:  Supple, without mass or thyromegaly. Lungs:  Clear to auscultation bilaterally, without wheezing, rales, or rhonchi.  Heart:  S1, S2 present without murmurs noted.  Abdomen:  +BS, soft, DIFFUSELY TTP, OUT OF PROPORTION TO PHYSICAL EXAM and non-distended. Without mass or HSM. No hernias noted. Rectal:  Deferred  Msk:  Symmetrical without gross deformities. Normal posture. Extremities:  Without clubbing or edema. Neurologic:  Alert and  oriented x4 Skin:  Intact, warm and dry without significant lesions  or rashes Cervical Nodes:  No significant cervical adenopathy. Psych:  Alert and cooperative. Normal mood and affect.

## 2011-05-07 NOTE — Patient Instructions (Signed)
Please have blood work done.   Start taking Miralax daily as needed for a bowel movement.   We will try to help refer you to a back specialist. Please keep the appointment at [facility] with Neurology.  We will call you back with results of the blood work and further work-up as needed.

## 2011-05-09 LAB — COMPREHENSIVE METABOLIC PANEL
ALT: 19 U/L (ref 0–53)
AST: 20 U/L (ref 0–37)
Albumin: 5.4 g/dL — ABNORMAL HIGH (ref 3.5–5.2)
Calcium: 10.3 mg/dL (ref 8.4–10.5)
Chloride: 102 mEq/L (ref 96–112)
Creat: 0.77 mg/dL (ref 0.50–1.35)
Potassium: 3.7 mEq/L (ref 3.5–5.3)
Sodium: 140 mEq/L (ref 135–145)
Total Protein: 7.5 g/dL (ref 6.0–8.3)

## 2011-05-09 LAB — URINALYSIS W MICROSCOPIC + REFLEX CULTURE
Bacteria, UA: NONE SEEN
Casts: NONE SEEN
Hgb urine dipstick: NEGATIVE
Nitrite: NEGATIVE
Protein, ur: NEGATIVE mg/dL
pH: 6 (ref 5.0–8.0)

## 2011-05-09 LAB — CBC WITH DIFFERENTIAL/PLATELET
Basophils Absolute: 0 10*3/uL (ref 0.0–0.1)
HCT: 48.9 % (ref 39.0–52.0)
Lymphocytes Relative: 34 % (ref 12–46)
Neutro Abs: 3.7 10*3/uL (ref 1.7–7.7)
Platelets: 199 10*3/uL (ref 150–400)
RDW: 12.8 % (ref 11.5–15.5)
WBC: 6.2 10*3/uL (ref 4.0–10.5)

## 2011-05-09 LAB — VITAMIN D 25 HYDROXY (VIT D DEFICIENCY, FRACTURES): Vit D, 25-Hydroxy: 23 ng/mL — ABNORMAL LOW (ref 30–89)

## 2011-05-09 LAB — PSA: PSA: 0.27 ng/mL (ref <=4.00)

## 2011-05-10 LAB — VITAMIN B1: Vitamin B1 (Thiamine): 8 nmol/L (ref 8–30)

## 2011-05-12 DIAGNOSIS — R531 Weakness: Secondary | ICD-10-CM | POA: Insufficient documentation

## 2011-05-12 DIAGNOSIS — R109 Unspecified abdominal pain: Secondary | ICD-10-CM | POA: Insufficient documentation

## 2011-05-12 DIAGNOSIS — M549 Dorsalgia, unspecified: Secondary | ICD-10-CM | POA: Insufficient documentation

## 2011-05-12 NOTE — Assessment & Plan Note (Signed)
Chronic. Question this is underlying cause of multiple issues. Needs to f/u with Spine, we are facilitating this for him.

## 2011-05-12 NOTE — Assessment & Plan Note (Signed)
Likely secondary to back pain, unable to exclude underlying cause at this time. Has gone from supposed highly active individual to using a walker. Agree absolutely with Neuro consultation. For completeness' sake, add on basic vitamin labs to have on file. Wt 190 in Dec, now 173. Overall concerning, CT negative for acute issues. Basic labs normal. No anemia. Question underlying psychological component. Will need to keep eye on wt. No EGD/TCS indicated at this time unless signs of anemia, rectal bleeding, concern for gastritis, PUD.

## 2011-05-12 NOTE — Assessment & Plan Note (Signed)
36 year old with numerous complaints, reports vague left-sided abdominal pain, "constant", without any associated factors. NOTES WORSENING WITH MOVEMENT. Constipation noted as well but no rectal bleeding. Pt on Bentyl. TTP diffusely on physical exam, but this is quite out of proportion to actual exam. No significant findings noted, pt has had 2 benign CTs, MRI, blood work that is unrevealing. DID NOT FOLLOW-UP WITH SPINE SPECIALISTS AS REQUESTED BY ED IN PAST. Doubt dealing with underlying gastritis/PUD due to presentation of symptoms. Highly suspicious for chronic back pain resulting in numerous complaints to include weakness, paresthesias, decreased mobility, increasing weakness. May have underlying IBS, constipation. No need for TCS at this time, no evidence of anemia or rectal bleeding. Will institute bowel regimen and refer to Spine Specialists for further assistance. Also seeing Neuro in May. Strongly recommended to keep this appt.   Checking UA and culture as pt was treated for UTI in past, also notes difficulty urinating. PSA Stop Bentyl Start Miralax CBC, CMP,

## 2011-05-13 NOTE — Progress Notes (Signed)
Faxed to PCP

## 2011-05-14 ENCOUNTER — Telehealth: Payer: Self-pay | Admitting: Gastroenterology

## 2011-05-14 NOTE — Telephone Encounter (Signed)
Referral and notes faxed to [facility]

## 2011-05-30 ENCOUNTER — Telehealth: Payer: Self-pay

## 2011-05-30 DIAGNOSIS — R634 Abnormal weight loss: Secondary | ICD-10-CM

## 2011-05-30 NOTE — Telephone Encounter (Signed)
Called, and VM not set up. Will mail a letter to call. )

## 2011-05-30 NOTE — Telephone Encounter (Signed)
Message copied by Lavena Bullion on Fri May 30, 2011 10:22 AM ------      Message from: [place], [place] W      Created: Fri May 30, 2011  9:29 AM       PSA normal.       Glucose elevated at 130, needs to see PCP.       Thiamine at lowest level of normal, can actually cause numbness, tingling, other issues.       No anemia.       Vit D insufficiency, close to considered deficiency, which is interesting.             1. Please call into pharmacy Vit D 50,000 iu gelcaps, take 1 weekly for 8 weeks, disp#8. Then, take Vit D 800  iu daily.       2. MVI with thiamine.       3. CC labs to PCP (I am unsure if pt has one, if not, needs one asap).       4. Check TTG, IgA and serum IgA      5. OV next week.

## 2011-05-30 NOTE — Progress Notes (Signed)
Quick Note:  PSA normal.  Glucose elevated at 130, needs to see PCP.  Thiamine at lowest level of normal, can actually cause numbness, tingling, other issues.  No anemia.  Vit D insufficiency, close to considered deficiency, which is interesting.   1. Please call into pharmacy Vit D 50,000 iu gelcaps, take 1 weekly for 8 weeks, disp#8. Then, take Vit D 800 iu daily.  2. MVI with thiamine.  3. CC labs to PCP (I am unsure if pt has one, if not, needs one asap).  4. Check TTG, IgA and serum IgA 5. OV next week.   ______

## 2011-06-03 NOTE — Progress Notes (Signed)
LABS REVIEWED. NL CBC/CMP.  REVIEWED.

## 2011-06-03 NOTE — Telephone Encounter (Signed)
Pt came by and was informed of his info. I gave a print out of [place] recommendation. Rx was called to [facility]. Info to be faxed to [facility]. Tobi Bastos said to schedule pt 2nd week of May and give time for the lab results. Also, pt said he has appt at [facility] neurology on 06/06/2011. He said he received a call from them and lost the call, and needed phone number to call. Per Crystal, phone number of [phone] was given to pt to call. I gave pt the lab orders to get labs done.

## 2011-06-03 NOTE — Progress Notes (Signed)
Per Christian Schwartz, pt has 100% cone assistance through 12/03/2011.

## 2011-06-05 LAB — TISSUE TRANSGLUTAMINASE, IGA: Tissue Transglutaminase Ab, IgA: 2.8 U/mL (ref <20)

## 2011-06-05 NOTE — Telephone Encounter (Signed)
Quick Note:  Negative celiac. Scheduled for May 8th with myself. Will review with him at this time.   ______

## 2011-06-11 ENCOUNTER — Encounter: Payer: Self-pay | Admitting: Gastroenterology

## 2011-06-11 ENCOUNTER — Ambulatory Visit (INDEPENDENT_AMBULATORY_CARE_PROVIDER_SITE_OTHER): Payer: Self-pay | Admitting: Gastroenterology

## 2011-06-11 DIAGNOSIS — R109 Unspecified abdominal pain: Secondary | ICD-10-CM

## 2011-06-11 DIAGNOSIS — E559 Vitamin D deficiency, unspecified: Secondary | ICD-10-CM

## 2011-06-11 DIAGNOSIS — M549 Dorsalgia, unspecified: Secondary | ICD-10-CM

## 2011-06-11 DIAGNOSIS — R531 Weakness: Secondary | ICD-10-CM

## 2011-06-11 DIAGNOSIS — R5381 Other malaise: Secondary | ICD-10-CM

## 2011-06-11 NOTE — Progress Notes (Signed)
Primary Care Physician:  ?[facility]? Pt requesting closer PCP Primary Gastroenterologist: Dr. Darrick Penna   Chief Complaint  Patient presents with  . Follow-up    HPI:   Presents today in follow-up, hx of chronic left-sided abdominal pain, constipation, chronic back pain, weakness. Please see note from April 3 for complete, detailed hx. He has had 2 benign CTs, MRI. He had been referred to [facility] by the ED but never followed through. At last visit, we referred him to [facility]; he notes some confusion regarding this. States never heard from them. Apparently, we had given him the number to call. Will try to refer again. At last visit noted numerous complaints of weakness, paresthesias, decreased mobility, increasing weakness, highly suspicious for chronic back pain issues. No signs of anemia, GI bleeding at last visit. Due to his reported wt loss of 190 in Dec to 173, rechecked some basic labs and included Vit D, thiamine, celiac panel, PSA. Vit D returned classified as insufficiency, celiac negative, thiamine low normal. Started on MVI with thiamine, Vit D 50,000 units weekly for 8 weeks. States significant overall improvement.   Returns today stating he fell 20 feet down some steps, dog yanked, fell on back. No confusion, mental status changes.  Haven't been able to get Miralax but denies constipation. No blood in stool. Ate some fire dept stew the other night, had diarrhea, now resolved.    Intermittent nausea, no vomiting. No rectal bleeding, no melena. Intermittent gas, depends on what he eats. Left-sided abdominal pain, same, some nights feels like stretching and twisting. Doesn't feel like an open wound anymore.  Weakness improved.   Wt up 5 lbs from April visit, encouraging. Appears overall in better spirits. NO WALKER TODAY  Past Medical History  Diagnosis Date  . Chronic back pain     hx of breaking back at age 45 and 5 years ago fell  through roof    Past Surgical History  Procedure Date  . None     Current Outpatient Prescriptions  Medication Sig Dispense Refill  . oxyCODONE-acetaminophen (PERCOCET) 5-325 MG per tablet Take 1 tablet by mouth every 4 (four) hours as needed. For pain       . dicyclomine (BENTYL) 20 MG tablet Take 1 tablet (20 mg total) by mouth every 6 (six) hours.  15 tablet  0  . LORazepam (ATIVAN) 1 MG tablet Take 1 mg by mouth every 8 (eight) hours.        Allergies as of 06/11/2011 - Review Complete 06/11/2011  Allergen Reaction Noted  . Tramadol Anaphylaxis and Itching 12/04/2010  . Sulfa antibiotics Other (See Comments) 12/04/2010    Family History  Problem Relation Age of Onset  . Colon cancer      adopted, unsure    History   Social History  . Marital Status: Single    Spouse Name: N/A    Number of Children: 1  . Years of Education: N/A   Occupational History  .     Social History Main Topics  . Smoking status: Current Everyday Smoker -- 0.5 packs/day    Types: Cigarettes  . Smokeless tobacco: None  . Alcohol Use: Yes     former (since 5 years) margarita here and there  . 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 Sexually Active: None   Other Topics Concern  . None   Social History Narrative  .  None    Review of Systems: Gen: Denies fever, chills, anorexia. Denies fatigue, weakness, weight loss.  CV: Denies chest pain, palpitations, syncope, peripheral edema, and claudication. Resp: Denies dyspnea at rest, cough, wheezing, coughing up blood, and pleurisy. GI: Denies vomiting blood, jaundice, and fecal incontinence.   Denies dysphagia or odynophagia. Derm: Denies rash, itching, dry skin Psych: Denies depression, anxiety, memory loss, confusion. No homicidal or suicidal ideation.  Heme: Denies bruising, bleeding, and enlarged lymph nodes.  Physical Exam: BP 102/58  Pulse 69  Temp(Src) 98.2 F (36.8 C) (Temporal)  Ht 5\' 10"  (1.778  m)  Wt 178 lb 9.6 oz (81.012 kg)  BMI 25.63 kg/m2 General:   Alert and oriented. No distress noted. Pleasant and cooperative.  Head:  Normocephalic and atraumatic. Eyes:  Conjuctiva clear without scleral icterus. Mouth:  Oral mucosa pink and moist. Good dentition. No lesions. Neck:  Supple, without mass or thyromegaly. Heart:  S1, S2 present without murmurs, rubs, or gallops. Regular rate and rhythm. Abdomen:  +BS, soft, VERY MILD TTP upper abdomen and non-distended. No rebound or guarding. No HSM or masses noted. SIGNIFICANTLY IMPROVED/CHANGED EXAM FROM ORIGINAL CONSULTATION. April 2013 WITH EXAGGERATED RESPONSE TO PALPATION.  Msk:  Symmetrical without gross deformities. Normal posture. Extremities:  Without edema. Neurologic:  Alert and  oriented x4;  grossly normal neurologically. Skin:  Intact without significant lesions or rashes. Cervical Nodes:  No significant cervical adenopathy. Psych:  Alert and cooperative. Normal mood and affect.

## 2011-06-11 NOTE — Patient Instructions (Signed)
Please complete the stool sample.   We will have you come back in 3 months. We would like to check your blood work prior to that visit to have on file. You will receive this in the mail a few weeks before the appointment.   Please call with any concerns in the meantime!

## 2011-06-12 DIAGNOSIS — E559 Vitamin D deficiency, unspecified: Secondary | ICD-10-CM | POA: Insufficient documentation

## 2011-06-12 NOTE — Assessment & Plan Note (Signed)
On regimen of 50,000 units weekly X 8 weeks. When complete, 800 units daily. Recheck prior to visit in 3 mos. Unclear etiology at this time (celiac negative).

## 2011-06-12 NOTE — Assessment & Plan Note (Signed)
IMPROVED. FEELS [facility] BETTER after starting MVI with thiamine, Vit D. Question underlying psychological component, although has real chronic back pain issues. Follow-up with [facility] as requested. Return in 3 mos with [facility], redraw thiamine and Vit D prior to visit for reassessment.

## 2011-06-12 NOTE — Assessment & Plan Note (Signed)
Still has not seen Spine Specialists as requested. Referred by ED AND OUR OFFICE. Some confusion regarding appt. We will try to facilitate this for him again. Concerned about chronic back pain causing majority of issues (weakness, numbness/tingling).

## 2011-06-12 NOTE — Assessment & Plan Note (Addendum)
Overall unchanged, denies worsening. No constipation. See HPI for full work-up. Question chronic pain at this point. No need for TCS/EGD unless continued wt loss, dyspepsia, GI bleeding. Wt up 5 lbs from visit. No need for Miralax, denies constipation. Doubt of much value, but we will order ifobt to have on file. Needs to see SLF in 3 mos.

## 2011-06-16 ENCOUNTER — Other Ambulatory Visit: Payer: Self-pay | Admitting: Gastroenterology

## 2011-06-16 DIAGNOSIS — R531 Weakness: Secondary | ICD-10-CM

## 2011-06-16 DIAGNOSIS — E559 Vitamin D deficiency, unspecified: Secondary | ICD-10-CM

## 2011-06-19 ENCOUNTER — Other Ambulatory Visit: Payer: Self-pay

## 2011-06-19 DIAGNOSIS — E559 Vitamin D deficiency, unspecified: Secondary | ICD-10-CM

## 2011-08-13 NOTE — Progress Notes (Signed)
MOST LIKELY FUNCTIONAL ABD PAIN. OPV LATE AUG OR SEP 2013 E 30 VISIT

## 2011-08-14 ENCOUNTER — Telehealth: Payer: Self-pay | Admitting: Gastroenterology

## 2011-08-14 NOTE — Telephone Encounter (Signed)
Do you want patient to come back in late August/early Sept or to just follow up in 3 months with you in E30? Please advise.

## 2011-08-15 NOTE — Telephone Encounter (Signed)
REVIEWED.  

## 2011-08-15 NOTE — Telephone Encounter (Signed)
Reminder in epic to follow up with SF in E30 in Sept 2013 °

## 2011-08-15 NOTE — Progress Notes (Signed)
Reminder in epic to follow up with SF in E30 in Sept 2013

## 2011-09-02 ENCOUNTER — Encounter: Payer: Self-pay | Admitting: Gastroenterology

## 2011-10-14 ENCOUNTER — Encounter: Payer: Self-pay | Admitting: Gastroenterology

## 2011-12-01 ENCOUNTER — Encounter (HOSPITAL_COMMUNITY): Payer: Self-pay | Admitting: *Deleted

## 2011-12-01 ENCOUNTER — Emergency Department (HOSPITAL_COMMUNITY): Payer: Self-pay

## 2011-12-01 ENCOUNTER — Emergency Department (HOSPITAL_COMMUNITY)
Admission: EM | Admit: 2011-12-01 | Discharge: 2011-12-01 | Disposition: A | Discharge status: Home / Self Care | Payer: Self-pay | Attending: Emergency Medicine | Admitting: Emergency Medicine

## 2011-12-01 DIAGNOSIS — F17200 Nicotine dependence, unspecified, uncomplicated: Secondary | ICD-10-CM | POA: Insufficient documentation

## 2011-12-01 DIAGNOSIS — Y929 Unspecified place or not applicable: Secondary | ICD-10-CM | POA: Insufficient documentation

## 2011-12-01 DIAGNOSIS — Y9389 Activity, other specified: Secondary | ICD-10-CM | POA: Insufficient documentation

## 2011-12-01 DIAGNOSIS — W108XXA Fall (on) (from) other stairs and steps, initial encounter: Secondary | ICD-10-CM | POA: Insufficient documentation

## 2011-12-01 DIAGNOSIS — Z8739 Personal history of other diseases of the musculoskeletal system and connective tissue: Secondary | ICD-10-CM | POA: Insufficient documentation

## 2011-12-01 DIAGNOSIS — S139XXA Sprain of joints and ligaments of unspecified parts of neck, initial encounter: Secondary | ICD-10-CM | POA: Insufficient documentation

## 2011-12-01 DIAGNOSIS — W19XXXA Unspecified fall, initial encounter: Secondary | ICD-10-CM

## 2011-12-01 DIAGNOSIS — S161XXA Strain of muscle, fascia and tendon at neck level, initial encounter: Secondary | ICD-10-CM

## 2011-12-01 DIAGNOSIS — M25562 Pain in left knee: Secondary | ICD-10-CM

## 2011-12-01 DIAGNOSIS — S8000XA Contusion of unspecified knee, initial encounter: Secondary | ICD-10-CM | POA: Insufficient documentation

## 2011-12-01 DIAGNOSIS — S8002XA Contusion of left knee, initial encounter: Secondary | ICD-10-CM

## 2011-12-01 DIAGNOSIS — E559 Vitamin D deficiency, unspecified: Secondary | ICD-10-CM | POA: Insufficient documentation

## 2011-12-01 DIAGNOSIS — Z79899 Other long term (current) drug therapy: Secondary | ICD-10-CM | POA: Insufficient documentation

## 2011-12-01 HISTORY — DX: Unspecified abdominal pain: R10.9

## 2011-12-01 HISTORY — DX: Other chronic pain: G89.29

## 2011-12-01 HISTORY — DX: Vitamin D deficiency, unspecified: E55.9

## 2011-12-01 MED ORDER — METHOCARBAMOL 500 MG PO TABS: 1000.0000 mg | ORAL_TABLET | Freq: Four times a day (QID) | ORAL | Status: DC | PRN

## 2011-12-01 MED ORDER — OXYCODONE-ACETAMINOPHEN 5-325 MG PO TABS: ORAL_TABLET | ORAL | Status: DC

## 2011-12-01 NOTE — ED Notes (Signed)
Pt discharged. Pt stable at time of discharge. Medications reviewed pt has no questions regarding discharge at this time. Pt voiced understanding of discharge instructions.  

## 2011-12-01 NOTE — ED Notes (Signed)
Pt states fell down 3-4 stairs this afternoon, causing injury to left knee and neck. Pt denies LOC. C-collar intact at this time. Pt states neck pain increases with movement, radiates pain into shoulders and upper back. Pt also reports pain increases when weight applied to rt knee. NAD noted no deformities noted.

## 2011-12-01 NOTE — ED Notes (Addendum)
Fell down 3-4 steps, pain lt knee and neck,  Did not hit his head, no loc.  C collar applied.

## 2011-12-01 NOTE — ED Notes (Signed)
C-collar removed per EDP. Xray's negative for fracture. Pt voiced no needs at this time.

## 2011-12-01 NOTE — ED Provider Notes (Signed)
History     CSN: [id number]  Arrival date & time 12/01/11  1456   First MD Initiated Contact with Patient 12/01/11 1607      Chief Complaint  Patient presents with  . Fall     HPI Pt was seen at 1610.  Per pt, c/o sudden onset and resolution of one episode of trip and fall down 3-4 steps PTA.  Pt states he "jarred" his neck and hit his left knee against the ground.  Denies hitting his head, no LOC.  Denies tingling/numbness in extremities, no focal motor weakness, no back pain, no CP/SOB, no abd pain, no prodromal symptoms before fall.     Past Medical History  Diagnosis Date  . Chronic back pain     hx of breaking back at age 88 and 5 years ago fell through roof  . Chronic abdominal pain   . Vitamin D deficiency     Past Surgical History  Procedure Date  . None     Family History  Problem Relation Age of Onset  . Colon cancer      adopted, unsure    History  Substance Use Topics  . Smoking status: Current Some Day Smoker -- 0.5 packs/day    Types: Cigarettes  . Smokeless tobacco: Not on file  . 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 ; ; Eyes: Negative for eye pain, redness and discharge. ; ; ENMT: Negative for ear pain, hoarseness, nasal congestion, sinus pressure and sore throat. ; ; Cardiovascular: Negative for chest pain, palpitations, diaphoresis, dyspnea and peripheral edema. ; ; Respiratory: Negative for cough, wheezing and stridor. ; ; Gastrointestinal: Negative for nausea, vomiting, diarrhea, abdominal pain, blood in stool, hematemesis, jaundice and rectal bleeding. . ; ; Genitourinary: Negative for dysuria, flank pain and hematuria. ; ; Musculoskeletal: +neck pain, knee pain. Negative for back pain. Negative for swelling and deformity.; ; Skin: Negative for pruritus, rash, abrasions,  blisters, bruising and skin lesion.; ; Neuro: Negative for headache, lightheadedness and neck stiffness. Negative for weakness, altered level of consciousness , altered mental status, extremity weakness, paresthesias, involuntary movement, seizure and syncope.       Allergies  Tramadol and Sulfa antibiotics  Home Medications   Current Outpatient Rx  Name Route Sig Dispense Refill  . VITAMIN D 2000 UNITS PO CAPS Oral Take 1 capsule by mouth daily.    . THIAMINE HCL 250 MG PO TABS Oral Take 250 mg by mouth daily.      BP 114/51  Pulse 67  Temp 98.3 F (36.8 C) (Oral)  Resp 20  Ht 5\' 10"  (1.778 m)  Wt 170 lb (77.111 kg)  BMI 24.39 kg/m2  SpO2 100%  Physical Exam 1615: Physical examination:  Nursing notes reviewed; Vital signs and O2 SAT reviewed;  Constitutional: Well developed, Well nourished, Well hydrated, In no acute distress; Head:  Normocephalic, atraumatic; Eyes: EOMI, PERRL, No scleral icterus; ENMT: Mouth and pharynx normal, Mucous membranes moist; Neck: Supple, Full range of motion, No lymphadenopathy; Cardiovascular: Regular rate and rhythm, No murmur, rub, or gallop; Respiratory: Breath sounds clear & equal bilaterally, No rales, rhonchi, wheezes.  Speaking full sentences with ease, Normal respiratory effort/excursion; Chest: Nontender, Movement normal; Abdomen: Soft, Nontender, Nondistended, Normal bowel sounds; Spine:  No midline CS, TS, LS tenderness. +  TTP hypertonic trapezius muscles.;;  Genitourinary: No CVA tenderness; Extremities: Pelvis stable. FROM bilat UE's and LE's without tenderness or deformity.  +FROM left knee, including able to lift extended LLE off stretcher, and extend left lower leg against resistance.  No ligamentous laxity.  No patellar or quad tendon step-offs.  NMS intact left foot, strong pedal pp.  No proximal fibular head tenderness.  No edema, erythema, warmth, ecchymosis or deformity.  No specific area of point tenderness.  NT left hip/ankle/foot.  Pulses normal, No tenderness, No edema, No calf edema or asymmetry.; Neuro: AA&Ox3, Major CN grossly intact.  Speech clear. Gait steady. Climbs on and off stretcher easily. No gross focal motor or sensory deficits in extremities.; Skin: Color normal, Warm, Dry.   ED Course  Procedures    MDM  MDM Reviewed: previous chart, nursing note and vitals Interpretation: x-ray and CT scan     Ct Cervical Spine Wo Contrast 12/01/2011  *RADIOLOGY REPORT*  Clinical Data:  Larey Seat down steps today.  Right-sided neck pain.  CT CERVICAL SPINE WITHOUT CONTRAST  Technique:  Multidetector CT imaging of the cervical spine was performed.  Multiplanar CT image reconstructions were also generated.  Comparison:   None.  Findings:  There is normal alignment of the cervical spine. Mild spondylosis is present with disc space narrowing at C6-C7.  There is no fracture or prevertebral soft tissue swelling.  No neck masses are seen.  Lung apices are clear.  Small lymph nodes are seen bilaterally, not definitely enlarged.  A central protrusion is suspected at C5-C6 potentially resulting in stenosis or neural encroachment.  The odontoid is intact.  IMPRESSION: No visible cervical spine fracture or traumatic subluxation.  Suspected central protrusion at C5-C6.  This is not clearly acute.   Original Report Authenticated By: Elsie Stain, M.D.    Dg Knee Complete 4 Views Left 12/01/2011  *RADIOLOGY REPORT*  Clinical Data: Fall with left knee pain.  LEFT KNEE - COMPLETE 4+ VIEW  Comparison: None.  Findings: No evidence of acute fracture, dislocation or joint effusion.  No significant degenerative changes.  No erosions or focal bony lesions identified.  Soft tissues are unremarkable.  IMPRESSION: Normal left knee.   Original Report Authenticated By: Reola Calkins, M.D.      [place]:  No midline CS tenderness, FROM CS without midline tenderness; c-collar removed.  Neuro exam remains intact.  Pt mostly c/o left knee pain.  Normal XR  left knee.  Will tx symptomatically at this time.  Pt wants to go home now.  Dx and testing d/w pt.  Questions answered.  Verb understanding, agreeable to d/c home with outpt f/u.       Laray Anger, DO 12/04/11 2111

## 2011-12-01 NOTE — ED Notes (Signed)
Pt called desk, states would like "something for  knee pain". EDP aware.

## 2012-02-13 ENCOUNTER — Encounter (HOSPITAL_COMMUNITY): Payer: Self-pay | Admitting: *Deleted

## 2012-02-13 ENCOUNTER — Emergency Department (HOSPITAL_COMMUNITY)
Admission: EM | Admit: 2012-02-13 | Discharge: 2012-02-13 | Disposition: A | Discharge status: Home / Self Care | Payer: Self-pay | Attending: Emergency Medicine | Admitting: Emergency Medicine

## 2012-02-13 ENCOUNTER — Emergency Department (HOSPITAL_COMMUNITY): Payer: Self-pay

## 2012-02-13 DIAGNOSIS — Y929 Unspecified place or not applicable: Secondary | ICD-10-CM | POA: Insufficient documentation

## 2012-02-13 DIAGNOSIS — S6000XA Contusion of unspecified finger without damage to nail, initial encounter: Secondary | ICD-10-CM | POA: Insufficient documentation

## 2012-02-13 DIAGNOSIS — F17200 Nicotine dependence, unspecified, uncomplicated: Secondary | ICD-10-CM | POA: Insufficient documentation

## 2012-02-13 DIAGNOSIS — W230XXA Caught, crushed, jammed, or pinched between moving objects, initial encounter: Secondary | ICD-10-CM | POA: Insufficient documentation

## 2012-02-13 DIAGNOSIS — S6010XA Contusion of unspecified finger with damage to nail, initial encounter: Secondary | ICD-10-CM

## 2012-02-13 DIAGNOSIS — G8929 Other chronic pain: Secondary | ICD-10-CM | POA: Insufficient documentation

## 2012-02-13 DIAGNOSIS — R109 Unspecified abdominal pain: Secondary | ICD-10-CM | POA: Insufficient documentation

## 2012-02-13 DIAGNOSIS — Z79899 Other long term (current) drug therapy: Secondary | ICD-10-CM | POA: Insufficient documentation

## 2012-02-13 DIAGNOSIS — Z8781 Personal history of (healed) traumatic fracture: Secondary | ICD-10-CM | POA: Insufficient documentation

## 2012-02-13 DIAGNOSIS — M549 Dorsalgia, unspecified: Secondary | ICD-10-CM | POA: Insufficient documentation

## 2012-02-13 DIAGNOSIS — Y9389 Activity, other specified: Secondary | ICD-10-CM | POA: Insufficient documentation

## 2012-02-13 MED ORDER — OXYCODONE-ACETAMINOPHEN 5-325 MG PO TABS
1.0000 | ORAL_TABLET | Freq: Once | ORAL | Status: AC
  Administered 2012-02-13: 1 via ORAL
  Filled 2012-02-13: qty 1

## 2012-02-13 MED ORDER — HYDROCODONE-ACETAMINOPHEN 5-325 MG PO TABS: ORAL_TABLET | ORAL | Status: DC

## 2012-02-13 NOTE — ED Notes (Signed)
Pt reports having ring finger of right hand closed in door.  Treated with ice and ibuprofen at home, minimal relief.

## 2012-02-13 NOTE — ED Provider Notes (Signed)
History     CSN: [id number]  Arrival date & time 02/13/12  2108   First MD Initiated Contact with Patient 02/13/12 2140      Chief Complaint  Patient presents with  . Finger Injury    (Consider location/radiation/quality/duration/timing/severity/associated sxs/prior treatment) HPI Comments: Patient c/o pain to the distal end of the right ring finger that began after accidentally closing a heavy wooden door on his finger.  C/o throbbing sensation to the end of his finger.  He denies numbness or weakness of the finger.  Patient is right hand dominant  Patient is a 37 y.o. male presenting with hand pain. The history is provided by the patient.  Hand Pain This is a new problem. The current episode started today. The problem occurs constantly. The problem has been unchanged. Associated symptoms include arthralgias. Pertinent negatives include no chills, fever, headaches, joint swelling, neck pain, numbness, rash, vomiting or weakness. Exacerbated by: palpation and movement. He has tried ice and position changes for the symptoms. The treatment provided no relief.    Past Medical History  Diagnosis Date  . Chronic back pain     hx of breaking back at age 60 and 5 years ago fell through roof  . Chronic abdominal pain   . Vitamin D deficiency     Past Surgical History  Procedure Date  . None     Family History  Problem Relation Age of Onset  . Colon cancer      adopted, unsure    History  Substance Use Topics  . Smoking status: Current Some Day Smoker -- 0.5 packs/day    Types: Cigarettes  . Smokeless tobacco: Not on file  . 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  HENT: Negative for neck pain.   Gastrointestinal: Negative for vomiting.  Genitourinary: Negative for dysuria and difficulty urinating.  Musculoskeletal: Positive for arthralgias. Negative for joint  swelling.  Skin: Negative for color change, rash and wound.  Neurological: Negative for weakness, numbness and headaches.  All other systems reviewed and are negative.    Allergies  Tramadol and Sulfa antibiotics  Home Medications   Current Outpatient Rx  Name  Route  Sig  Dispense  Refill  . VITAMIN D 2000 UNITS PO CAPS   Oral   Take 1 capsule by mouth daily.         . THIAMINE HCL 250 MG PO TABS   Oral   Take 250 mg by mouth daily.           BP 118/67  Pulse 63  Temp 98.5 F (36.9 C) (Oral)  Resp 16  Ht 5\' 10"  (1.778 m)  Wt 172 lb (78.019 kg)  BMI 24.68 kg/m2  SpO2 98%  Physical Exam  Nursing note and vitals reviewed. Constitutional: He is oriented to person, place, and time. He appears well-developed and well-nourished. No distress.  HENT:  Head: Normocephalic and atraumatic.  Cardiovascular: Normal rate, regular rhythm and normal heart sounds.   Pulmonary/Chest: Effort normal and breath sounds normal.  Musculoskeletal: He exhibits tenderness. He exhibits no edema.       Right hand: He exhibits tenderness. He exhibits normal range of motion, no bony tenderness, normal two-point discrimination, normal capillary refill, no deformity, no laceration and no swelling. normal sensation noted. Normal strength noted.       Hands:  ttp of the distal right ring finger.  Black discoloration to the fingernail. Fingernail is intact, no edema , bony deformity or bleeding.  Radial pulse is brisk, distal  sensation intact.  CR< 2 sec.  Patient has full ROM.  Neurological: He is alert and oriented to person, place, and time. He exhibits normal muscle tone. Coordination normal.  Skin: Skin is warm and dry.    ED Course  Procedures (including critical care time)  Labs Reviewed - No data to display Dg Finger Ring Right  02/13/2012  *RADIOLOGY REPORT*  Clinical Data: Finger injury.  Bruising.  RIGHT RING FINGER 2+V  Comparison: 03/04/2006  Findings: No acute bony  abnormality.  No fracture, subluxation or dislocation.  Joint spaces are maintained.  Small metallic foreign body again noted within the soft tissues between the third and fourth digits, stable since 2008.  IMPRESSION: No acute bony abnormality.   Original Report Authenticated By: Charlett Nose, M.D.         MDM     Finger splinted, pain improved, remains NV intact.    Recommended drainage of subungual hematoma, but pt prefers not to have procedure preformed.  I have advised him that he will likely lose the fingernail, he continues to defer procedure at this time and verbalized understanding to me.     Given referral for Dr. Romeo Apple if needed  Prescribed: norco #20  Zeek Rostron L. Ashtian Villacis, [place] 02/14/12 1321

## 2012-02-15 NOTE — ED Provider Notes (Signed)
Medical screening examination/treatment/procedure(s) were performed by non-physician practitioner and as supervising physician I was immediately available for consultation/collaboration.   Carleene Cooper III, MD 02/15/12 2037

## 2012-04-20 ENCOUNTER — Emergency Department (HOSPITAL_COMMUNITY)
Admission: EM | Admit: 2012-04-20 | Discharge: 2012-04-20 | Disposition: A | Discharge status: Home / Self Care | Payer: Self-pay | Attending: Emergency Medicine | Admitting: Emergency Medicine

## 2012-04-20 ENCOUNTER — Emergency Department (HOSPITAL_COMMUNITY): Payer: Self-pay

## 2012-04-20 ENCOUNTER — Encounter (HOSPITAL_COMMUNITY): Payer: Self-pay | Admitting: *Deleted

## 2012-04-20 DIAGNOSIS — G8929 Other chronic pain: Secondary | ICD-10-CM | POA: Insufficient documentation

## 2012-04-20 DIAGNOSIS — W010XXA Fall on same level from slipping, tripping and stumbling without subsequent striking against object, initial encounter: Secondary | ICD-10-CM | POA: Insufficient documentation

## 2012-04-20 DIAGNOSIS — Z79899 Other long term (current) drug therapy: Secondary | ICD-10-CM | POA: Insufficient documentation

## 2012-04-20 DIAGNOSIS — Y9289 Other specified places as the place of occurrence of the external cause: Secondary | ICD-10-CM | POA: Insufficient documentation

## 2012-04-20 DIAGNOSIS — F17200 Nicotine dependence, unspecified, uncomplicated: Secondary | ICD-10-CM | POA: Insufficient documentation

## 2012-04-20 DIAGNOSIS — Z8639 Personal history of other endocrine, nutritional and metabolic disease: Secondary | ICD-10-CM | POA: Insufficient documentation

## 2012-04-20 DIAGNOSIS — S5010XA Contusion of unspecified forearm, initial encounter: Secondary | ICD-10-CM | POA: Insufficient documentation

## 2012-04-20 DIAGNOSIS — Y9339 Activity, other involving climbing, rappelling and jumping off: Secondary | ICD-10-CM | POA: Insufficient documentation

## 2012-04-20 MED ORDER — CYCLOBENZAPRINE HCL 10 MG PO TABS: 10.0000 mg | ORAL_TABLET | Freq: Three times a day (TID) | ORAL | Status: DC | PRN

## 2012-04-20 MED ORDER — IBUPROFEN 800 MG PO TABS: 800.0000 mg | ORAL_TABLET | Freq: Three times a day (TID) | ORAL | Status: DC

## 2012-04-20 NOTE — ED Notes (Signed)
States he fell 6 ft off a ladder prior to arrival, states he landed on right forearm, pain in right forearm, denies any head injury

## 2012-04-20 NOTE — ED Provider Notes (Signed)
History     CSN: [id number]  Arrival date & time 04/20/12  1555   First MD Initiated Contact with Patient 04/20/12 1749      Chief Complaint  Patient presents with  . Fall    (Consider location/radiation/quality/duration/timing/severity/associated sxs/prior treatment) HPI  Patient reports he was jumping down a steep side of the hill with a cut out with a place to step, however he had mud on his shoes and he slipped and he fell forward landing with his weight on his right forearm. He denies hitting his head or loss of consciousness. He states this happened about 2 PM. He states he has tingling of his fingers especially fourth and fifth anger of the right hand. He also has some mild tingling in the base of his right thumb. He states he has some pain in his proximal forearm.  PCP none  Past Medical History  Diagnosis Date  . Chronic back pain     hx of breaking back at age 55 and 5 years ago fell through roof  . Chronic abdominal pain   . Vitamin D deficiency     Past Surgical History  Procedure Laterality Date  . None      Family History  Problem Relation Age of Onset  . Colon cancer      adopted, unsure    History  Substance Use Topics  . Smoking status: Current Some Day Smoker -- 0.50 packs/day    Types: Cigarettes  . Smokeless tobacco: Not on file  . Alcohol Use: Yes     Comment: former (since 5 years) [employer] here and there   unemployed   Review of Systems  All other systems reviewed and are negative.    Allergies  Tramadol and Sulfa antibiotics  Home Medications   Current Outpatient Rx  Name  Route  Sig  Dispense  Refill  . Cholecalciferol (VITAMIN D) 2000 UNITS CAPS   Oral   Take 1 capsule by mouth daily.         Marland Kitchen ibuprofen (ADVIL,MOTRIN) 200 MG tablet   Oral   Take 200 mg by mouth every 6 (six) hours as needed for pain (and/or inflammation).         Marland Kitchen oxyCODONE-acetaminophen (PERCOCET/ROXICET) 5-325 MG per tablet   Oral   Take 2  tablets by mouth once as needed for pain.         Marland Kitchen thiamine 250 MG tablet   Oral   Take 250 mg by mouth daily.           BP 127/70  Pulse 60  Temp(Src) 97.7 F (36.5 C) (Oral)  Resp 18  Ht 5\' 10"  (1.778 m)  Wt 175 lb (79.379 kg)  BMI 25.11 kg/m2  SpO2 97%  Vital signs normal    Physical Exam  Nursing note and vitals reviewed. Constitutional: He is oriented to person, place, and time. He appears well-developed and well-nourished.  Non-toxic appearance. He does not appear ill. No distress.  HENT:  Head: Normocephalic and atraumatic.  Right Ear: External ear normal.  Left Ear: External ear normal.  Nose: Nose normal. No mucosal edema or rhinorrhea.  Mouth/Throat: Oropharynx is clear and moist and mucous membranes are normal. No dental abscesses or edematous.  Eyes: Conjunctivae and EOM are normal. Pupils are equal, round, and reactive to light.  Neck: Normal range of motion and full passive range of motion without pain. Neck supple.  Pulmonary/Chest: Effort normal and breath sounds normal. No respiratory distress. He  has no rhonchi. He exhibits no crepitus.  Abdominal: Normal appearance.  Musculoskeletal: Normal range of motion. He exhibits no edema and no tenderness.       Arms: Moves all extremities well. Has some swelling over the ulnar aspect of his left forearm. He has no pain to palpation in his right shoulder, right elbow, or right wrist. He has no joint effusions. He's noted to have some localized swelling on his forearm and has some tenderness there. He can move all fingers well and he has good distal pulses.  Neurological: He is alert and oriented to person, place, and time. He has normal strength. No cranial nerve deficit.  Skin: Skin is warm, dry and intact. No rash noted. No erythema. No pallor.  Psychiatric: He has a normal mood and affect. His speech is normal and behavior is normal. His mood appears not anxious.    ED Course  Procedures (including critical  care time)  Pt placed in a velco splint.    Dg Forearm Right  04/20/2012  *RADIOLOGY REPORT*  Clinical Data: Larey Seat and injured right forearm.  Limited range of motion.  RIGHT FOREARM - 2 VIEW  Comparison: Right elbow and right wrist x-rays 02/14/2006.  No prior forearm imaging.  Findings: Soft tissue swelling/hematoma overlying the mid ulna.  No evidence of acute or subacute fracture involving the radius or ulna.  Distal radioulnar joint intact.  No evidence of dislocation at the elbow.  Well-preserved bone mineral density.  No intrinsic osseous abnormalities.  Visualized wrist joint and elbow joint intact.  IMPRESSION: No osseous abnormality.   Original Report Authenticated By: Hulan Saas, M.D.      1. Fall, initial encounter   2. Contusion, forearm, right, initial encounter    Discharge Medication List as of 04/20/2012  7:17 PM    START taking these medications   Details  cyclobenzaprine (FLEXERIL) 10 MG tablet Take 1 tablet (10 mg total) by mouth 3 (three) times daily as needed for muscle spasms., Starting 04/20/2012, Until Discontinued, Print    !! ibuprofen (ADVIL,MOTRIN) 800 MG tablet Take 1 tablet (800 mg total) by mouth 3 (three) times daily., Starting 04/20/2012, Until Discontinued, Print     !! - Potential duplicate medications found. Please discuss with provider.      Plan discharge  Devoria Albe, MD, FACEP    MDM          Ward Givens, MD 04/20/12 2116

## 2012-05-28 ENCOUNTER — Emergency Department (HOSPITAL_COMMUNITY)
Admission: EM | Admit: 2012-05-28 | Discharge: 2012-05-29 | Disposition: A | Discharge status: Home / Self Care | Payer: Self-pay | Attending: Emergency Medicine | Admitting: Emergency Medicine

## 2012-05-28 ENCOUNTER — Encounter (HOSPITAL_COMMUNITY): Payer: Self-pay

## 2012-05-28 DIAGNOSIS — Z8739 Personal history of other diseases of the musculoskeletal system and connective tissue: Secondary | ICD-10-CM | POA: Insufficient documentation

## 2012-05-28 DIAGNOSIS — W57XXXA Bitten or stung by nonvenomous insect and other nonvenomous arthropods, initial encounter: Secondary | ICD-10-CM

## 2012-05-28 DIAGNOSIS — Y929 Unspecified place or not applicable: Secondary | ICD-10-CM | POA: Insufficient documentation

## 2012-05-28 DIAGNOSIS — J302 Other seasonal allergic rhinitis: Secondary | ICD-10-CM

## 2012-05-28 DIAGNOSIS — E559 Vitamin D deficiency, unspecified: Secondary | ICD-10-CM | POA: Insufficient documentation

## 2012-05-28 DIAGNOSIS — S2095XA Superficial foreign body of unspecified parts of thorax, initial encounter: Secondary | ICD-10-CM | POA: Insufficient documentation

## 2012-05-28 DIAGNOSIS — Z79899 Other long term (current) drug therapy: Secondary | ICD-10-CM | POA: Insufficient documentation

## 2012-05-28 DIAGNOSIS — Y9389 Activity, other specified: Secondary | ICD-10-CM | POA: Insufficient documentation

## 2012-05-28 DIAGNOSIS — F17200 Nicotine dependence, unspecified, uncomplicated: Secondary | ICD-10-CM | POA: Insufficient documentation

## 2012-05-28 MED ORDER — DOXYCYCLINE HYCLATE 100 MG PO TABS
100.0000 mg | ORAL_TABLET | Freq: Once | ORAL | Status: AC
  Administered 2012-05-28: 100 mg via ORAL
  Filled 2012-05-28: qty 1

## 2012-05-28 MED ORDER — DOXYCYCLINE HYCLATE 100 MG PO CAPS: 100.0000 mg | ORAL_CAPSULE | Freq: Two times a day (BID) | ORAL | Status: DC

## 2012-05-28 MED ORDER — LORATADINE 10 MG PO TABS: ORAL_TABLET | ORAL | Status: DC

## 2012-05-28 MED ORDER — LORATADINE 10 MG PO TABS
10.0000 mg | ORAL_TABLET | Freq: Once | ORAL | Status: AC
  Administered 2012-05-28: 10 mg via ORAL
  Filled 2012-05-28: qty 1

## 2012-05-28 NOTE — ED Notes (Signed)
Several tick bites in past 2 days, pt states spot on right groin is swollen, red streaks and itches. Also now c/o of body aches and generally feeling ill

## 2012-05-28 NOTE — ED Provider Notes (Signed)
History  This chart was scribed for Flint Melter, MD by Greggory Stallion, ED Scribe. This patient was seen in room [facility]06/[facility]06 and the patient's care was started at 10:45 PM.   CSN: [id number]  Arrival date & time 05/28/12  2136   First MD Initiated Contact with Patient 05/28/12 2245      Chief Complaint  Patient presents with  . Insect Bite    The history is provided by the patient. No language interpreter was used.    Christian Schwartz is a 37 y.o. male with h/o chronic back pain who presents to the Emergency Department complaining of tick bites over the past few days. Pt states he was bit in his testicles and in the back and it is now swollen and tender. He states he has confusion. Pt denies taking any medication for the treatment. 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 Pt states he is allergic tramadol. Pt states he as no PCP.    Past Medical History  Diagnosis Date  . Chronic back pain     hx of breaking back at age 34 and 5 years ago fell through roof  . Chronic abdominal pain   . Vitamin D deficiency     Past Surgical History  Procedure Laterality Date  . None      Family History  Problem Relation Age of Onset  . Colon cancer      adopted, unsure    History  Substance Use Topics  . Smoking status: Current Some Day Smoker -- 0.50 packs/day    Types: Cigarettes  . Smokeless tobacco: Not on file  . Alcohol Use: Yes     Comment: former (since 5 years) margarita here and there      Review of Systems  Allergies  Tramadol and Sulfa antibiotics  Home Medications   Current Outpatient Rx  Name  Route  Sig  Dispense  Refill  . acetaminophen (TYLENOL) 500 MG tablet   Oral   Take 500-1,000 mg by mouth daily as needed for pain.         . Cholecalciferol (VITAMIN D) 2000 UNITS CAPS   Oral   Take 1 capsule by mouth daily.         Marland Kitchen ibuprofen (ADVIL,MOTRIN) 800 MG tablet   Oral   Take 1 tablet (800 mg total) by mouth 3 (three) times  daily.   21 tablet   0   . thiamine 250 MG tablet   Oral   Take 250 mg by mouth daily.         Marland Kitchen doxycycline (VIBRAMYCIN) 100 MG capsule   Oral   Take 1 capsule (100 mg total) by mouth 2 (two) times daily.   20 capsule   0   . loratadine (CLARITIN) 10 MG tablet      One po daily for allergy symptoms   30 tablet   0   . oxyCODONE-acetaminophen (PERCOCET/ROXICET) 5-325 MG per tablet   Oral   Take 2 tablets by mouth once as needed for pain.           Triage Vitals: BP 114/56  Pulse 65  Temp(Src) 98.4 F (36.9 C) (Oral)  Resp 16  Ht 5\' 10"  (1.778 m)  Wt 181 lb 7 oz (82.3 kg)  BMI 26.03 kg/m2  SpO2 100%  Physical Exam  Nursing note and vitals reviewed. Constitutional: He appears well-developed and well-nourished.  HENT:  Head: Normocephalic and atraumatic.  Right Ear: External ear normal.  Left Ear: External ear normal.  No meningismus.   Eyes: Conjunctivae and EOM are normal. Pupils are equal, round, and reactive to light.  Neck: Normal range of motion and phonation normal. Neck supple.  Cardiovascular: Normal rate, regular rhythm, normal heart sounds and intact distal pulses.   Pulmonary/Chest: Effort normal and breath sounds normal. He exhibits no bony tenderness.  Abdominal: Soft. Normal appearance. There is no tenderness.  Musculoskeletal: Normal range of motion.  Left upper shoulder: 1 cm raised, red area. Tenderness to palpation.  Neurological: He is alert. He has normal strength. No cranial nerve deficit or sensory deficit. He exhibits normal muscle tone. Coordination normal.  No pronator drift. No trunk scale.  Skin: Skin is warm, dry and intact.  Psychiatric: He has a normal mood and affect. His behavior is normal. Judgment and thought content normal.    ED Course  Procedures (including critical care time)  DIAGNOSTIC STUDIES: Oxygen Saturation is 100% on RA, normal by my interpretation.    COORDINATION OF CARE: 10:55 PM-Discussed treatment  plan which includes  (CXR, CBC panel, CMP, UA) with pt at bedside and pt agreed to plan.          1. Tick bite   2. Seasonal allergic rhinitis       MDM  The bites with possible erythema chronic migrans. Doubt [facility] procedure. Doubt metabolic instability, serious bacterial infection or impending vascular collapse; the patient is stable for discharge.   Nursing Notes Reviewed/ Care Coordinated, and agree without changes. Applicable Imaging Reviewed.  Interpretation of Laboratory Data incorporated into ED treatment    Plan: Home Medications- Doxycycline; Home Treatments- rest; Recommended follow up- PCP of choice prn    I personally performed the services described in this documentation, which was scribed in my presence. The recorded information has been reviewed and is accurate.     Flint Melter, MD 05/30/12 [phone]

## 2012-06-10 ENCOUNTER — Encounter (HOSPITAL_COMMUNITY): Payer: Self-pay | Admitting: *Deleted

## 2012-06-10 ENCOUNTER — Emergency Department (HOSPITAL_COMMUNITY)
Admission: EM | Admit: 2012-06-10 | Discharge: 2012-06-10 | Disposition: A | Discharge status: Home / Self Care | Payer: Self-pay | Attending: Emergency Medicine | Admitting: Emergency Medicine

## 2012-06-10 DIAGNOSIS — IMO0002 Reserved for concepts with insufficient information to code with codable children: Secondary | ICD-10-CM | POA: Insufficient documentation

## 2012-06-10 DIAGNOSIS — E559 Vitamin D deficiency, unspecified: Secondary | ICD-10-CM | POA: Insufficient documentation

## 2012-06-10 DIAGNOSIS — S0180XA Unspecified open wound of other part of head, initial encounter: Secondary | ICD-10-CM | POA: Insufficient documentation

## 2012-06-10 DIAGNOSIS — Y9289 Other specified places as the place of occurrence of the external cause: Secondary | ICD-10-CM | POA: Insufficient documentation

## 2012-06-10 DIAGNOSIS — S0181XA Laceration without foreign body of other part of head, initial encounter: Secondary | ICD-10-CM

## 2012-06-10 DIAGNOSIS — G8929 Other chronic pain: Secondary | ICD-10-CM | POA: Insufficient documentation

## 2012-06-10 DIAGNOSIS — F17200 Nicotine dependence, unspecified, uncomplicated: Secondary | ICD-10-CM | POA: Insufficient documentation

## 2012-06-10 DIAGNOSIS — Y9389 Activity, other specified: Secondary | ICD-10-CM | POA: Insufficient documentation

## 2012-06-10 DIAGNOSIS — Z87828 Personal history of other (healed) physical injury and trauma: Secondary | ICD-10-CM | POA: Insufficient documentation

## 2012-06-10 DIAGNOSIS — Z79899 Other long term (current) drug therapy: Secondary | ICD-10-CM | POA: Insufficient documentation

## 2012-06-10 MED ORDER — LIDOCAINE HCL (PF) 2 % IJ SOLN
INTRAMUSCULAR | Status: AC
  Filled 2012-06-10: qty 10

## 2012-06-10 MED ORDER — HYDROCODONE-ACETAMINOPHEN 5-325 MG PO TABS
1.0000 | ORAL_TABLET | Freq: Once | ORAL | Status: AC
  Administered 2012-06-10: 1 via ORAL
  Filled 2012-06-10: qty 1

## 2012-06-10 MED ORDER — BACITRACIN ZINC 500 UNIT/GM EX OINT
TOPICAL_OINTMENT | CUTANEOUS | Status: AC
  Filled 2012-06-10: qty 0.9

## 2012-06-10 NOTE — ED Notes (Signed)
Laceration to chin from a car door.  Bleeding controlled.
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
## 2012-06-11 NOTE — ED Provider Notes (Signed)
Medical screening examination/treatment/procedure(s) were performed by non-physician practitioner and as supervising physician I was immediately available for consultation/collaboration. Remy Voiles, MD, FACEP   Zarayah Lanting L Kadence Mimbs, MD 06/11/12 0110 

## 2012-08-09 ENCOUNTER — Telehealth: Payer: Self-pay | Admitting: Gastroenterology

## 2012-08-09 ENCOUNTER — Ambulatory Visit: Payer: Self-pay | Admitting: Gastroenterology

## 2012-08-09 NOTE — Telephone Encounter (Signed)
Pt was a no show

## 2012-08-09 NOTE — Telephone Encounter (Signed)
Please send note for f/u 

## 2012-08-10 ENCOUNTER — Encounter: Payer: Self-pay | Admitting: Gastroenterology

## 2012-08-10 NOTE — Telephone Encounter (Signed)
Mailed letter for pt to call our office to [facility] °

## 2012-08-22 ENCOUNTER — Emergency Department (HOSPITAL_COMMUNITY)
Admission: EM | Admit: 2012-08-22 | Discharge: 2012-08-22 | Disposition: A | Discharge status: Home / Self Care | Payer: Self-pay | Attending: Emergency Medicine | Admitting: Emergency Medicine

## 2012-08-22 ENCOUNTER — Encounter (HOSPITAL_COMMUNITY): Payer: Self-pay | Admitting: *Deleted

## 2012-08-22 DIAGNOSIS — F17200 Nicotine dependence, unspecified, uncomplicated: Secondary | ICD-10-CM | POA: Insufficient documentation

## 2012-08-22 DIAGNOSIS — K089 Disorder of teeth and supporting structures, unspecified: Secondary | ICD-10-CM | POA: Insufficient documentation

## 2012-08-22 DIAGNOSIS — R599 Enlarged lymph nodes, unspecified: Secondary | ICD-10-CM | POA: Insufficient documentation

## 2012-08-22 DIAGNOSIS — G8929 Other chronic pain: Secondary | ICD-10-CM | POA: Insufficient documentation

## 2012-08-22 DIAGNOSIS — Z87828 Personal history of other (healed) physical injury and trauma: Secondary | ICD-10-CM | POA: Insufficient documentation

## 2012-08-22 DIAGNOSIS — K044 Acute apical periodontitis of pulpal origin: Secondary | ICD-10-CM | POA: Insufficient documentation

## 2012-08-22 DIAGNOSIS — E559 Vitamin D deficiency, unspecified: Secondary | ICD-10-CM | POA: Insufficient documentation

## 2012-08-22 DIAGNOSIS — K029 Dental caries, unspecified: Secondary | ICD-10-CM | POA: Insufficient documentation

## 2012-08-22 MED ORDER — AMOXICILLIN 500 MG PO CAPS: 500.0000 mg | ORAL_CAPSULE | Freq: Three times a day (TID) | ORAL | Status: DC

## 2012-08-22 MED ORDER — HYDROCODONE-ACETAMINOPHEN 5-325 MG PO TABS: 1.0000 | ORAL_TABLET | ORAL | Status: DC | PRN

## 2012-08-22 MED ORDER — IBUPROFEN 800 MG PO TABS
800.0000 mg | ORAL_TABLET | Freq: Once | ORAL | Status: AC
  Administered 2012-08-22: 800 mg via ORAL
  Filled 2012-08-22: qty 1

## 2012-08-22 NOTE — ED Notes (Signed)
Pt c/o pain to wisdom tooth on the right bottom part of his jaw.

## 2012-08-22 NOTE — ED Provider Notes (Signed)
History    CSN: [id number] Arrival date & time 08/22/12  2127  First MD Initiated Contact with Patient 08/22/12 2154     Chief Complaint  Patient presents with  . Dental Pain   (Consider location/radiation/quality/duration/timing/severity/associated sxs/prior Treatment) Patient is a 37 y.o. male presenting with tooth pain. The history is provided by the patient.  Dental Pain Location:  Lower Lower teeth location:  32/RL 3rd molar Quality:  Throbbing and constant Severity:  Moderate Chronicity:  New Associated symptoms: no fever and no headaches    Past Medical History  Diagnosis Date  . Chronic back pain     hx of breaking back at age 72 and 5 years ago fell through roof  . Chronic abdominal pain   . Vitamin D deficiency    Past Surgical History  Procedure Laterality Date  . None     Family History  Problem Relation Age of Onset  . Colon cancer      adopted, unsure   History  Substance Use Topics  . Smoking status: Current Every Day Smoker -- 0.50 packs/day    Types: Cigarettes  . Smokeless tobacco: Not on file  . 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  HENT: Positive for dental problem.   Gastrointestinal: Negative for nausea and vomiting.  Skin: Negative for rash.  Neurological: Negative for headaches.  Psychiatric/Behavioral: The patient is not nervous/anxious.     Allergies  Tramadol and Sulfa antibiotics  Home Medications   Current Outpatient Rx  Name  Route  Sig  Dispense  Refill  . acetaminophen (TYLENOL) 500 MG tablet   Oral   Take 500-1,000 mg by mouth daily as needed for pain.         . Cholecalciferol (VITAMIN D) 2000 UNITS CAPS   Oral   Take 1 capsule by mouth daily.         Marland Kitchen doxycycline (VIBRAMYCIN) 100 MG capsule   Oral   Take 1 capsule (100 mg total) by mouth 2 (two) times daily.   20 capsule   0   . ibuprofen  (ADVIL,MOTRIN) 800 MG tablet   Oral   Take 1 tablet (800 mg total) by mouth 3 (three) times daily.   21 tablet   0   . loratadine (CLARITIN) 10 MG tablet      One po daily for allergy symptoms   30 tablet   0   . oxyCODONE-acetaminophen (PERCOCET/ROXICET) 5-325 MG per tablet   Oral   Take 2 tablets by mouth once as needed for pain.         Marland Kitchen thiamine 250 MG tablet   Oral   Take 250 mg by mouth daily.          BP 111/50  Pulse 75  Temp(Src) 98.7 F (37.1 C) (Oral)  Resp 20  Ht 5\' 10"  (1.778 m)  Wt 181 lb 11.2 oz (82.419 kg)  BMI 26.07 kg/m2  SpO2 100% Physical Exam  Nursing note and vitals reviewed. Constitutional: He is oriented to person, place, and time. He appears well-developed and well-nourished. No distress.  HENT:  Head: Normocephalic.  Mouth/Throat: Uvula is midline, oropharynx is clear and moist and mucous membranes are normal.    Right lower wisdom tooth broken and decayed. Tender on palpation  Eyes: EOM are normal.  Neck: Neck supple.  Cardiovascular: Normal rate and regular  rhythm.   Pulmonary/Chest: Effort normal and breath sounds normal.  Musculoskeletal: Normal range of motion.  Lymphadenopathy:    He has cervical adenopathy (right).  Neurological: He is alert and oriented to person, place, and time. No cranial nerve deficit.  Skin: Skin is warm and dry.  Psychiatric: He has a normal mood and affect. His behavior is normal.    ED Course  Procedures  MDM  37 y.o. male with infected and broken right lower wisdom tooth. Will treat pain and infection.  Discussed with the patient clinical findings and plan of care and all questioned fully answered. He will return if any problems arise.    Medication List    TAKE these medications       amoxicillin 500 MG capsule  Commonly known as:  AMOXIL  Take 1 capsule (500 mg total) by mouth 3 (three) times daily.     HYDROcodone-acetaminophen 5-325 MG per tablet  Commonly known as:  NORCO/VICODIN    Take 1 tablet by mouth every 4 (four) hours as needed.      ASK your doctor about these medications       ibuprofen 200 MG tablet  Commonly known as:  ADVIL,MOTRIN  Take 800 mg by mouth every 6 (six) hours as needed for pain.     thiamine 250 MG tablet  Take 250 mg by mouth daily.     Vitamin D 2000 UNITS Caps  Take 1 capsule by mouth daily.         [address] [place], [place] 08/23/12 [phone]

## 2012-08-23 NOTE — ED Provider Notes (Signed)
Medical screening examination/treatment/procedure(s) were performed by non-physician practitioner and as supervising physician I was immediately available for consultation/collaboration.  Wilbert Schouten L Anallely Rosell, MD 08/23/12 0056 

## 2012-12-22 ENCOUNTER — Emergency Department (HOSPITAL_COMMUNITY): Payer: Self-pay

## 2012-12-22 ENCOUNTER — Encounter (HOSPITAL_COMMUNITY): Payer: Self-pay | Admitting: Emergency Medicine

## 2012-12-22 ENCOUNTER — Emergency Department (HOSPITAL_COMMUNITY)
Admission: EM | Admit: 2012-12-22 | Discharge: 2012-12-22 | Disposition: A | Discharge status: Home / Self Care | Payer: Self-pay | Attending: Emergency Medicine | Admitting: Emergency Medicine

## 2012-12-22 DIAGNOSIS — Z862 Personal history of diseases of the blood and blood-forming organs and certain disorders involving the immune mechanism: Secondary | ICD-10-CM | POA: Insufficient documentation

## 2012-12-22 DIAGNOSIS — R109 Unspecified abdominal pain: Secondary | ICD-10-CM | POA: Insufficient documentation

## 2012-12-22 DIAGNOSIS — Z91040 Latex allergy status: Secondary | ICD-10-CM | POA: Insufficient documentation

## 2012-12-22 DIAGNOSIS — F17200 Nicotine dependence, unspecified, uncomplicated: Secondary | ICD-10-CM | POA: Insufficient documentation

## 2012-12-22 DIAGNOSIS — K625 Hemorrhage of anus and rectum: Secondary | ICD-10-CM | POA: Insufficient documentation

## 2012-12-22 DIAGNOSIS — G8929 Other chronic pain: Secondary | ICD-10-CM | POA: Insufficient documentation

## 2012-12-22 DIAGNOSIS — Z8639 Personal history of other endocrine, nutritional and metabolic disease: Secondary | ICD-10-CM | POA: Insufficient documentation

## 2012-12-22 LAB — CBC WITH DIFFERENTIAL/PLATELET
Basophils Absolute: 0 K/uL (ref 0.0–0.1)
Basophils Relative: 0 % (ref 0–1)
Eosinophils Absolute: 0.1 K/uL (ref 0.0–0.7)
Eosinophils Relative: 1 % (ref 0–5)
HCT: 44.9 % (ref 39.0–52.0)
Hemoglobin: 15.5 g/dL (ref 13.0–17.0)
Lymphocytes Relative: 30 % (ref 12–46)
Lymphs Abs: 2.5 K/uL (ref 0.7–4.0)
MCH: 32.4 pg (ref 26.0–34.0)
MCHC: 34.5 g/dL (ref 30.0–36.0)
MCV: 93.9 fL (ref 78.0–100.0)
Monocytes Absolute: 0.4 K/uL (ref 0.1–1.0)
Monocytes Relative: 5 % (ref 3–12)
Neutro Abs: 5.2 K/uL (ref 1.7–7.7)
Neutrophils Relative %: 64 % (ref 43–77)
Platelets: 193 K/uL (ref 150–400)
RBC: 4.78 MIL/uL (ref 4.22–5.81)
RDW: 12.4 % (ref 11.5–15.5)
WBC: 8.2 K/uL (ref 4.0–10.5)

## 2012-12-22 LAB — COMPREHENSIVE METABOLIC PANEL WITH GFR
ALT: 9 U/L (ref 0–53)
AST: 12 U/L (ref 0–37)
Albumin: 4.3 g/dL (ref 3.5–5.2)
Alkaline Phosphatase: 61 U/L (ref 39–117)
BUN: 6 mg/dL (ref 6–23)
CO2: 30 meq/L (ref 19–32)
Calcium: 9.8 mg/dL (ref 8.4–10.5)
Chloride: 101 meq/L (ref 96–112)
Creatinine, Ser: 0.8 mg/dL (ref 0.50–1.35)
GFR calc Af Amer: >90 mL/min (ref >90)
GFR calc non Af Amer: >90 mL/min (ref >90)
Glucose, Bld: 91 mg/dL (ref 70–99)
Potassium: 3.8 meq/L (ref 3.5–5.1)
Sodium: 139 meq/L (ref 135–145)
Total Bilirubin: 0.4 mg/dL (ref 0.3–1.2)
Total Protein: 7.4 g/dL (ref 6.0–8.3)

## 2012-12-22 MED ORDER — OXYCODONE-ACETAMINOPHEN 5-325 MG PO TABS
1.0000 | ORAL_TABLET | Freq: Once | ORAL | Status: AC
  Administered 2012-12-22: 1 via ORAL
  Filled 2012-12-22: qty 1

## 2012-12-22 MED ORDER — HYDROCODONE-ACETAMINOPHEN 5-325 MG PO TABS: 1.0000 | ORAL_TABLET | Freq: Four times a day (QID) | ORAL | Status: DC | PRN

## 2012-12-22 MED ORDER — IOHEXOL 300 MG/ML  SOLN
100.0000 mL | Freq: Once | INTRAMUSCULAR | Status: AC | PRN
  Administered 2012-12-22: 100 mL via INTRAVENOUS

## 2012-12-22 MED ORDER — METRONIDAZOLE 500 MG PO TABS
500.0000 mg | ORAL_TABLET | Freq: Once | ORAL | Status: AC
  Administered 2012-12-22: 500 mg via ORAL
  Filled 2012-12-22: qty 1

## 2012-12-22 MED ORDER — METRONIDAZOLE 500 MG PO TABS: 500.0000 mg | ORAL_TABLET | Freq: Two times a day (BID) | ORAL | Status: DC

## 2012-12-22 NOTE — ED Notes (Signed)
Patient given discharge instruction, verbalized understand. IV removed, band aid applied. Patient ambulatory out of the department.  

## 2012-12-22 NOTE — ED Provider Notes (Signed)
CSN: [id number]     Arrival date & time 12/22/12  1433 History   First MD Initiated Contact with Patient 12/22/12 1637     Chief Complaint  Patient presents with  . GI Bleeding   (Consider location/radiation/quality/duration/timing/severity/associated sxs/prior Treatment) Patient is a 37 y.o. male presenting with hematochezia. The history is provided by the patient (the pt complains of rectal bleeding and abd pain).  Rectal Bleeding Quality:  Bright red Timing:  Intermittent Progression:  Improving Chronicity:  New Context: not anal fissures   Associated symptoms: abdominal pain     Past Medical History  Diagnosis Date  . Chronic back pain     hx of breaking back at age 24 and 5 years ago fell through roof  . Chronic abdominal pain   . Vitamin D deficiency    Past Surgical History  Procedure Laterality Date  . None     Family History  Problem Relation Age of Onset  . Colon cancer      adopted, unsure   History  Substance Use Topics  . Smoking status: Current Every Day Smoker -- 0.50 packs/day    Types: Cigarettes  . Smokeless tobacco: Not on file  . Alcohol Use: Yes     Comment: former (since 5 years) margarita here and there    Review of Systems  Constitutional: Negative for appetite change and fatigue.  HENT: Negative for congestion, ear discharge and sinus pressure.   Eyes: Negative for discharge.  Respiratory: Negative for cough.   Cardiovascular: Negative for chest pain.  Gastrointestinal: Positive for abdominal pain, hematochezia and anal bleeding. Negative for diarrhea.  Genitourinary: Negative for frequency and hematuria.  Musculoskeletal: Negative for back pain.  Skin: Negative for rash.  Neurological: Negative for seizures and headaches.  Psychiatric/Behavioral: Negative for hallucinations.    Allergies  Tramadol; Latex; and Sulfa antibiotics  Home Medications   Current Outpatient Rx  Name  Route  Sig  Dispense  Refill  . acetaminophen  (TYLENOL) 500 MG tablet   Oral   Take 1,000 mg by mouth every 6 (six) hours as needed. pain         . ibuprofen (ADVIL,MOTRIN) 200 MG tablet   Oral   Take 800 mg by mouth every 6 (six) hours as needed for pain.         Marland Kitchen HYDROcodone-acetaminophen (NORCO/VICODIN) 5-325 MG per tablet   Oral   Take 1 tablet by mouth every 6 (six) hours as needed for moderate pain.   20 tablet   0   . metroNIDAZOLE (FLAGYL) 500 MG tablet   Oral   Take 1 tablet (500 mg total) by mouth 2 (two) times daily. One po bid x 7 days   14 tablet   0    BP 113/62  Pulse 54  Temp(Src) 98.7 F (37.1 C) (Oral)  Resp 18  Ht 5\' 10"  (1.778 m)  Wt 183 lb 1 oz (83.037 kg)  BMI 26.27 kg/m2  SpO2 100% Physical Exam  Constitutional: He is oriented to person, place, and time. He appears well-developed.  HENT:  Head: Normocephalic.  Eyes: Conjunctivae and EOM are normal. No scleral icterus.  Neck: Neck supple. No thyromegaly present.  Cardiovascular: Normal rate and regular rhythm.  Exam reveals no gallop and no friction rub.   No murmur heard. Pulmonary/Chest: No stridor. He has no wheezes. He has no rales. He exhibits no tenderness.  Abdominal: He exhibits no distension. There is tenderness. There is no rebound.  Genitourinary: Rectum  normal.  Hem neg  Musculoskeletal: Normal range of motion. He exhibits no edema.  Lymphadenopathy:    He has no cervical adenopathy.  Neurological: He is oriented to person, place, and time. He exhibits normal muscle tone. Coordination normal.  Skin: No rash noted. No erythema.  Psychiatric: He has a normal mood and affect. His behavior is normal.    ED Course  Procedures (including critical care time) Labs Review Labs Reviewed  CBC WITH DIFFERENTIAL  COMPREHENSIVE METABOLIC PANEL   Imaging Review Ct Abdomen Pelvis W Contrast  12/22/2012   CLINICAL DATA:  Pain and bright red blood per rectum  EXAM: CT ABDOMEN AND PELVIS WITH CONTRAST  TECHNIQUE: Multidetector CT  imaging of the abdomen and pelvis was performed using the standard protocol following bolus administration of intravenous contrast. Oral contrast was also administered.  CONTRAST:  OMNIPAQUE IOHEXOL 300 MG/ML  SOLN  COMPARISON:  February 25, 2011  FINDINGS: There is slight scarring in the lung bases. Lung bases otherwise appear clear.  Liver is prominent measuring 17.4 cm in length. There is mild fatty infiltration near the fissure for the ligamentum teres. No focal liver lesions are identified. There is no biliary duct dilatation.  Spleen, pancreas, and adrenals appear normal.  Kidneys bilaterally show no mass or hydronephrosis on either side. No renal or ureteral calculus is appreciated on this study.  In the pelvis, there is borderline thickening of the urinary bladder wall. There is no pelvic mass or fluid collection. Subcentimeter inguinal lymph nodes bilaterally are noted, a finding probably due to reactive type etiology.  Appendix is not appreciable. There is no periappendiceal region inflammation.  There is no bowel obstruction. No free air or portal venous air. There is slight wall thickening in several loops of mid jejunum.  There is no ascites or abscess in the abdomen or pelvis. There is no adenopathy by size criteria.  Aorta is nonaneurysmal. There is degenerative change in the lumbar spine. There are no blastic or lytic bone lesions.  IMPRESSION: Evidence suggesting mild jejunal enteritis. No bowel obstruction. No abscess. Appendix is not seen; there is no periappendiceal region inflammation.  The liver is mildly prominent with mild fatty infiltration.  Borderline wall thickening in the urinary bladder. Question a degree of cystitis.   Electronically Signed   By: Bretta Bang M.D.   On: 12/22/2012 19:40    EKG Interpretation   None       MDM   1. Rectal bleeding        Benny Lennert, MD 12/22/12 2053

## 2012-12-22 NOTE — ED Notes (Signed)
Pt ambulatory to the bathroom. Gave pt warm blanket.

## 2012-12-22 NOTE — ED Notes (Signed)
Pt continues to complain of lower right jaw pain.

## 2012-12-22 NOTE — ED Notes (Signed)
Pt reports was feeling gassy and bloated yesterday so he used a saline enema.  Pt says passed bright red blood in stool x 3.  Reports passed a clot early this morning.  C/O rectal pain and abd pain.  Pt says has also had a toothache for the past few months.

## 2012-12-27 ENCOUNTER — Telehealth: Payer: Self-pay | Admitting: Gastroenterology

## 2012-12-27 ENCOUNTER — Ambulatory Visit: Payer: Self-pay | Admitting: Gastroenterology

## 2012-12-27 NOTE — Telephone Encounter (Signed)
Pt was a no show

## 2013-05-12 ENCOUNTER — Encounter (HOSPITAL_COMMUNITY): Payer: Self-pay | Admitting: Emergency Medicine

## 2013-05-12 ENCOUNTER — Emergency Department (HOSPITAL_COMMUNITY)
Admission: EM | Admit: 2013-05-12 | Discharge: 2013-05-12 | Disposition: A | Discharge status: Home / Self Care | Payer: Self-pay | Attending: Emergency Medicine | Admitting: Emergency Medicine

## 2013-05-12 DIAGNOSIS — Z792 Long term (current) use of antibiotics: Secondary | ICD-10-CM | POA: Insufficient documentation

## 2013-05-12 DIAGNOSIS — K0889 Other specified disorders of teeth and supporting structures: Secondary | ICD-10-CM

## 2013-05-12 DIAGNOSIS — R197 Diarrhea, unspecified: Secondary | ICD-10-CM | POA: Insufficient documentation

## 2013-05-12 DIAGNOSIS — G8929 Other chronic pain: Secondary | ICD-10-CM | POA: Insufficient documentation

## 2013-05-12 DIAGNOSIS — K089 Disorder of teeth and supporting structures, unspecified: Secondary | ICD-10-CM | POA: Insufficient documentation

## 2013-05-12 DIAGNOSIS — R112 Nausea with vomiting, unspecified: Secondary | ICD-10-CM | POA: Insufficient documentation

## 2013-05-12 DIAGNOSIS — R109 Unspecified abdominal pain: Secondary | ICD-10-CM | POA: Insufficient documentation

## 2013-05-12 DIAGNOSIS — Z91040 Latex allergy status: Secondary | ICD-10-CM | POA: Insufficient documentation

## 2013-05-12 DIAGNOSIS — F17200 Nicotine dependence, unspecified, uncomplicated: Secondary | ICD-10-CM | POA: Insufficient documentation

## 2013-05-12 DIAGNOSIS — R51 Headache: Secondary | ICD-10-CM | POA: Insufficient documentation

## 2013-05-12 MED ORDER — ONDANSETRON 8 MG PO TBDP
8.0000 mg | ORAL_TABLET | Freq: Once | ORAL | Status: AC
  Administered 2013-05-12: 8 mg via ORAL
  Filled 2013-05-12: qty 1

## 2013-05-12 MED ORDER — OXYCODONE-ACETAMINOPHEN 5-325 MG PO TABS
1.0000 | ORAL_TABLET | Freq: Once | ORAL | Status: AC
  Administered 2013-05-12: 1 via ORAL
  Filled 2013-05-12: qty 1

## 2013-05-12 MED ORDER — PENICILLIN V POTASSIUM 250 MG PO TABS
500.0000 mg | ORAL_TABLET | Freq: Once | ORAL | Status: AC
  Administered 2013-05-12: 500 mg via ORAL
  Filled 2013-05-12: qty 2

## 2013-05-12 MED ORDER — AMOXICILLIN 500 MG PO CAPS: 500.0000 mg | ORAL_CAPSULE | Freq: Three times a day (TID) | ORAL | Status: DC

## 2013-05-12 NOTE — ED Notes (Signed)
Pt asking of nerve block, MD aware.Patient given discharge instruction, verbalized understand. Patient ambulatory out of the department.

## 2013-05-12 NOTE — Discharge Instructions (Signed)

## 2013-05-12 NOTE — ED Notes (Signed)
Pt reports has had toothache x almost 1 year.  Reports also has headache and abd pain.  Reports has had abd pain x 4 or 5 days.  Reports diarrhea and nausea, no vomiting.

## 2013-05-12 NOTE — ED Provider Notes (Signed)
CSN: [id number]     Arrival date & time 05/12/13  1747 History  This chart was scribed for Joya Gaskins, MD by Bennett Scrape, ED Scribe. This patient was seen in room [facility]05/[facility] and the patient's care was started at 6:11 PM.   Chief Complaint  Patient presents with  . Abdominal Pain  . Dental Pain     Patient is a 38 y.o. male presenting with tooth pain. The history is provided by the patient. No language interpreter was used.  Dental Pain Location:  Lower Severity:  Severe Onset quality:  Gradual Duration:  1 year Timing:  Constant Progression:  Waxing and waning Chronicity:  Chronic Context: abscess and dental fracture   Previous work-up:  Dental exam Associated symptoms: headaches   Associated symptoms: no fever     HPI Comments: Christian Schwartz is a 38 y.o. male who presents to the Emergency Department complaining of dental pain over the past year that he attributes to a dental fracture, receding gum line and recurrent abscess. He states that the pain radiates into the right face and head at times. He reports associated abdominal pain, nausea, emesis and diarrhea over the past few days. He states that he has tried one dose of Pepto Bismol and developed tinnitus after which he stopped taking the medication. He denies having insurance currently to follow up with a dentist and states that he has been coming to the ED to get antibiotics when symptoms become worse.  He denies any recent cough, hematemesis, hematuria or dysuria as associated symptoms.   Past Medical History  Diagnosis Date  . Chronic back pain     hx of breaking back at age 27 and 5 years ago fell through roof  . Chronic abdominal pain   . Vitamin D deficiency    Past Surgical History  Procedure Laterality Date  . None     Family History  Problem Relation Age of Onset  . Colon cancer      adopted, unsure   History  Substance Use Topics  . Smoking status: Current Every Day Smoker -- 0.50 packs/day     Types: Cigarettes  . Smokeless tobacco: Not on file  . Alcohol Use: Yes     Comment: former (since 5 years)    Review of Systems  Constitutional: Negative for fever.  HENT: Positive for dental problem.   Respiratory: Negative for cough.   Gastrointestinal: Positive for nausea, vomiting, abdominal pain and diarrhea. Negative for blood in stool.  Genitourinary: Negative for hematuria.  Neurological: Positive for headaches.  All other systems reviewed and are negative.   Allergies  Tramadol; Latex; and Sulfa antibiotics  Home Medications   Current Outpatient Rx  Name  Route  Sig  Dispense  Refill  . acetaminophen (TYLENOL) 500 MG tablet   Oral   Take 1,000 mg by mouth every 6 (six) hours as needed. pain         . HYDROcodone-acetaminophen (NORCO/VICODIN) 5-325 MG per tablet   Oral   Take 1 tablet by mouth every 6 (six) hours as needed for moderate pain.   20 tablet   0   . ibuprofen (ADVIL,MOTRIN) 200 MG tablet   Oral   Take 800 mg by mouth every 6 (six) hours as needed for pain.         . metroNIDAZOLE (FLAGYL) 500 MG tablet   Oral   Take 1 tablet (500 mg total) by mouth 2 (two) times daily. One po bid x  7 days   14 tablet   0    Triage Vitals: BP 123/76  Pulse 61  Temp(Src) 98.4 F (36.9 C) (Oral)  Resp 18  Ht 5\' 10"  (1.778 m)  Wt 187 lb (84.823 kg)  BMI 26.83 kg/m2  SpO2 100%  Physical Exam  Nursing note and vitals reviewed.  CONSTITUTIONAL: Well developed/well nourished HEAD AND FACE: Normocephalic/atraumatic EYES: EOMI/PERRL, no icterus  ENMT: Mucous membranes moist.  Poor dentition.  No trismus.  No focal abscess noted. NECK: supple no meningeal signs CV: S1/S2 noted, no murmurs/rubs/gallops noted LUNGS: Lungs are clear to auscultation bilaterally, no apparent distress ABDOMEN: soft, nontender, no rebound or guarding NEURO: Pt is awake/alert, moves all extremitiesx4 EXTREMITIES:full ROM SKIN: warm, color normal  ED Course  Procedures    Medications  ondansetron (ZOFRAN-ODT) disintegrating tablet 8 mg (not administered)  oxyCODONE-acetaminophen (PERCOCET/ROXICET) 5-325 MG per tablet 1 tablet (not administered)  penicillin v potassium (VEETID) tablet 500 mg (not administered)    DIAGNOSTIC STUDIES: Oxygen Saturation is 100% on RA, normal by my interpretation.    COORDINATION OF CARE: 6:16 PM-Discussed treatment plan which includes antiemetic, pain medications and antibiotic with pt at bedside and pt agreed to plan.   Patient mostly concerned about his dental pain.  He briefly mentions abd pain and diarrhea.  He is well appearing,  Abdominal exam unremarkable Given amoxicillin and referred to dentistry  MDM   Final diagnoses:  Abdominal pain  Diarrhea  Pain, dental    I personally performed the services described in this documentation, which was scribed in my presence. The recorded information has been reviewed and is accurate.        Joya Gaskinsonald W Siyana Erney, MD 05/12/13 [phone]

## 2013-07-15 ENCOUNTER — Emergency Department (HOSPITAL_COMMUNITY): Payer: Self-pay

## 2013-07-15 ENCOUNTER — Encounter (HOSPITAL_COMMUNITY): Payer: Self-pay | Admitting: Emergency Medicine

## 2013-07-15 ENCOUNTER — Emergency Department (HOSPITAL_COMMUNITY)
Admission: EM | Admit: 2013-07-15 | Discharge: 2013-07-15 | Disposition: A | Discharge status: Home / Self Care | Payer: Self-pay | Attending: Emergency Medicine | Admitting: Emergency Medicine

## 2013-07-15 DIAGNOSIS — G8929 Other chronic pain: Secondary | ICD-10-CM | POA: Insufficient documentation

## 2013-07-15 DIAGNOSIS — F17200 Nicotine dependence, unspecified, uncomplicated: Secondary | ICD-10-CM | POA: Insufficient documentation

## 2013-07-15 DIAGNOSIS — N453 Epididymo-orchitis: Secondary | ICD-10-CM | POA: Insufficient documentation

## 2013-07-15 DIAGNOSIS — N451 Epididymitis: Secondary | ICD-10-CM

## 2013-07-15 DIAGNOSIS — Z91040 Latex allergy status: Secondary | ICD-10-CM | POA: Insufficient documentation

## 2013-07-15 DIAGNOSIS — R42 Dizziness and giddiness: Secondary | ICD-10-CM | POA: Insufficient documentation

## 2013-07-15 DIAGNOSIS — Z79899 Other long term (current) drug therapy: Secondary | ICD-10-CM | POA: Insufficient documentation

## 2013-07-15 DIAGNOSIS — R1031 Right lower quadrant pain: Secondary | ICD-10-CM | POA: Insufficient documentation

## 2013-07-15 LAB — CBC WITH DIFFERENTIAL/PLATELET
Basophils Absolute: 0 10*3/uL (ref 0.0–0.1)
Basophils Relative: 0 % (ref 0–1)
EOS ABS: 0.1 10*3/uL (ref 0.0–0.7)
EOS PCT: 1 % (ref 0–5)
HEMATOCRIT: 43.7 % (ref 39.0–52.0)
Hemoglobin: 15.5 g/dL (ref 13.0–17.0)
LYMPHS ABS: 3.2 10*3/uL (ref 0.7–4.0)
LYMPHS PCT: 29 % (ref 12–46)
MCH: 32.4 pg (ref 26.0–34.0)
MCHC: 35.5 g/dL (ref 30.0–36.0)
MCV: 91.2 fL (ref 78.0–100.0)
MONO ABS: 0.8 10*3/uL (ref 0.1–1.0)
MONOS PCT: 7 % (ref 3–12)
Neutro Abs: 7 10*3/uL (ref 1.7–7.7)
Neutrophils Relative %: 63 % (ref 43–77)
Platelets: 192 10*3/uL (ref 150–400)
RBC: 4.79 MIL/uL (ref 4.22–5.81)
RDW: 12.5 % (ref 11.5–15.5)
WBC: 11.1 10*3/uL — AB (ref 4.0–10.5)

## 2013-07-15 LAB — BASIC METABOLIC PANEL
BUN: 10 mg/dL (ref 6–23)
CALCIUM: 9.1 mg/dL (ref 8.4–10.5)
CO2: 24 meq/L (ref 19–32)
Chloride: 101 mEq/L (ref 96–112)
Creatinine, Ser: 0.75 mg/dL (ref 0.50–1.35)
GFR calc Af Amer: >90 mL/min (ref >90)
Glucose, Bld: 97 mg/dL (ref 70–99)
Potassium: 3.2 mEq/L — ABNORMAL LOW (ref 3.7–5.3)
Sodium: 141 mEq/L (ref 137–147)

## 2013-07-15 LAB — I-STAT CG4 LACTIC ACID, ED: LACTIC ACID, VENOUS: 1.02 mmol/L (ref 0.5–2.2)

## 2013-07-15 LAB — URINALYSIS, ROUTINE W REFLEX MICROSCOPIC
GLUCOSE, UA: NEGATIVE mg/dL
Hgb urine dipstick: NEGATIVE
Ketones, ur: 40 mg/dL — AB
Leukocytes, UA: NEGATIVE
Nitrite: NEGATIVE
Specific Gravity, Urine: 1.015 (ref 1.005–1.030)
UROBILINOGEN UA: 2 mg/dL — AB (ref 0.0–1.0)
pH: 7 (ref 5.0–8.0)

## 2013-07-15 LAB — URINE MICROSCOPIC-ADD ON

## 2013-07-15 MED ORDER — OXYCODONE-ACETAMINOPHEN 5-325 MG PO TABS: 1.0000 | ORAL_TABLET | ORAL | Status: DC | PRN

## 2013-07-15 MED ORDER — PROMETHAZINE HCL 25 MG/ML IJ SOLN
25.0000 mg | Freq: Once | INTRAMUSCULAR | Status: AC
  Administered 2013-07-15: 25 mg via INTRAVENOUS
  Filled 2013-07-15: qty 1

## 2013-07-15 MED ORDER — LIDOCAINE HCL (PF) 1 % IJ SOLN
INTRAMUSCULAR | Status: AC
  Administered 2013-07-15: 09:00:00
  Filled 2013-07-15: qty 5

## 2013-07-15 MED ORDER — ONDANSETRON HCL 4 MG/2ML IJ SOLN
4.0000 mg | Freq: Once | INTRAMUSCULAR | Status: DC
  Filled 2013-07-15: qty 2

## 2013-07-15 MED ORDER — DOXYCYCLINE HYCLATE 100 MG PO CAPS: 100.0000 mg | ORAL_CAPSULE | Freq: Two times a day (BID) | ORAL | Status: DC

## 2013-07-15 MED ORDER — CEPHALEXIN 500 MG PO CAPS: 500.0000 mg | ORAL_CAPSULE | Freq: Two times a day (BID) | ORAL | Status: DC

## 2013-07-15 MED ORDER — SODIUM CHLORIDE 0.9 % IV BOLUS (SEPSIS)
1000.0000 mL | Freq: Once | INTRAVENOUS | Status: AC
  Administered 2013-07-15: 1000 mL via INTRAVENOUS

## 2013-07-15 MED ORDER — PROMETHAZINE HCL 25 MG PO TABS: 25.0000 mg | ORAL_TABLET | Freq: Four times a day (QID) | ORAL | Status: DC | PRN

## 2013-07-15 MED ORDER — IBUPROFEN 400 MG PO TABS: 600.0000 mg | ORAL_TABLET | ORAL | Status: DC | PRN

## 2013-07-15 MED ORDER — PROMETHAZINE HCL 12.5 MG PO TABS: 25.0000 mg | ORAL_TABLET | Freq: Four times a day (QID) | ORAL | Status: DC | PRN

## 2013-07-15 MED ORDER — MORPHINE SULFATE 4 MG/ML IJ SOLN
4.0000 mg | Freq: Once | INTRAMUSCULAR | Status: AC
  Administered 2013-07-15: 4 mg via INTRAVENOUS
  Filled 2013-07-15: qty 1

## 2013-07-15 MED ORDER — CEFTRIAXONE SODIUM 250 MG IJ SOLR
250.0000 mg | Freq: Once | INTRAMUSCULAR | Status: AC
  Administered 2013-07-15: 250 mg via INTRAMUSCULAR
  Filled 2013-07-15: qty 250

## 2013-07-15 MED ORDER — IBUPROFEN 600 MG PO TABS: 600.0000 mg | ORAL_TABLET | Freq: Four times a day (QID) | ORAL | Status: DC | PRN

## 2013-07-15 NOTE — ED Notes (Signed)
Per EMS, pt. Reports testicular pain radiating to right lower quadrant. Pt. Received 500 ML NS bolus. EMS placed 20 in right AC.

## 2013-07-15 NOTE — ED Notes (Signed)
Pt. C/o testicular pain that radiates to right lower quadrant. Pt. Denies injury.

## 2013-07-15 NOTE — ED Provider Notes (Addendum)
CSN: [id number]     Arrival date & time 07/15/13  0541 History   First MD Initiated Contact with Patient 07/15/13 [phone]     Chief Complaint  Patient presents with  . Testicle Pain  . Abdominal Pain     (Consider location/radiation/quality/duration/timing/severity/associated sxs/prior Treatment) HPI Comments: Pt comes in with cc of RLQ pain and testicular pain. Pain started 2 days ago, but has gotten worse over time, and now quite severe. Pain is worse with ambulation. There is no hx of trauma, renal stones and pt denies any uti or penile discharge. + nausea, no emesis.   The history is provided by the patient.    Past Medical History  Diagnosis Date  . Chronic back pain     hx of breaking back at age 38 and 5 years ago fell through roof  . Chronic abdominal pain   . Vitamin D deficiency    Past Surgical History  Procedure Laterality Date  . None     Family History  Problem Relation Age of Onset  . Colon cancer      adopted, unsure   History  Substance Use Topics  . Smoking status: Current Every Day Smoker -- 0.50 packs/day    Types: Cigarettes  . Smokeless tobacco: Not on file  . Alcohol Use: Yes     Comment: former (since 5 years)    Review of Systems  Constitutional: Negative for fever, chills and activity change.  Eyes: Negative for visual disturbance.  Respiratory: Negative for cough, chest tightness and shortness of breath.   Cardiovascular: Negative for chest pain.  Gastrointestinal: Negative for abdominal distention.  Genitourinary: Positive for scrotal swelling and testicular pain. Negative for dysuria, hematuria, flank pain, discharge, enuresis, difficulty urinating and penile pain.  Musculoskeletal: Negative for arthralgias and neck pain.  Neurological: Positive for dizziness. Negative for light-headedness and headaches.  Psychiatric/Behavioral: Negative for confusion.      Allergies  Tramadol; Latex; Sulfa antibiotics; and Zofran  Home Medications    Prior to Admission medications   Medication Sig Start Date End Date Taking? Authorizing Provider  ibuprofen (ADVIL,MOTRIN) 800 MG tablet Take 800 mg by mouth.   Yes Historical Provider, MD  amoxicillin (AMOXIL) 500 MG capsule Take 1 capsule (500 mg total) by mouth 3 (three) times daily. 05/12/13   Joya Gaskinsonald W Wickline, MD   BP 114/62  Pulse 54  Temp(Src) 98.6 F (37 C) (Oral)  Resp 16  Ht 5\' 10"  (1.778 m)  Wt 185 lb (83.915 kg)  BMI 26.54 kg/m2  SpO2 100% Physical Exam  Nursing note and vitals reviewed. Constitutional: He is oriented to person, place, and time. He appears well-developed.  HENT:  Head: Normocephalic and atraumatic.  Eyes: Conjunctivae and EOM are normal. Pupils are equal, round, and reactive to light.  Neck: Normal range of motion. Neck supple.  Cardiovascular: Normal rate, regular rhythm and normal heart sounds.   Pulmonary/Chest: Effort normal and breath sounds normal. No respiratory distress. He has no wheezes.  Abdominal: Soft. Bowel sounds are normal. He exhibits no distension. There is no tenderness. There is no rebound and no guarding.  Genitourinary:  Tenderness over the right testicle with mass superior to the testicle. Cremasteric reflex is present. There is no torsion appreciated on exam.  Neurological: He is alert and oriented to person, place, and time.  Skin: Skin is warm.    ED Course  Procedures (including critical care time) Labs Review Labs Reviewed  CBC WITH DIFFERENTIAL  BASIC METABOLIC PANEL  URINALYSIS, ROUTINE W REFLEX MICROSCOPIC  I-STAT CG4 LACTIC ACID, ED    Imaging Review No results found.   EKG Interpretation None      MDM   Final diagnoses:  None    PT comes in with right testicular pain x 2 days, acutely worse overnight. Exam shows + cremasteric reflex, no scrotal edema or cellulitis and normal penile exam. Testicles are free moving. Will get US scrotum. Testicular torsion chances are really low - and the initial  impression is epididymitis vs. Hernia. Hydrocele possible as well. Torsion unlikely  -given that x present x 2 days.  US ordered, and results pending. Dr. Wilkie AyeHorton to f/u on the imaging,     Derwood KaplanAnkit Gleen Ripberger, MD 07/15/13 [id number]  Derwood KaplanAnkit Teckla Christiansen, MD 07/17/13 [phone]

## 2013-07-15 NOTE — Discharge Instructions (Signed)
Please wear briefs, or underwear that provides support to your testicles. Ice the area. Take the antibiotics and complete the course. See Urologist if pain continues.   Epididymitis Epididymitis is a swelling (inflammation) of the epididymis. The epididymis is a cord-like structure along the back part of the testicle. Epididymitis is usually, but not always, caused by infection. This is usually a sudden problem beginning with chills, fever and pain behind the scrotum and in the testicle. There may be swelling and redness of the testicle. DIAGNOSIS  Physical examination will reveal a tender, swollen epididymis. Sometimes, cultures are obtained from the urine or from prostate secretions to help find out if there is an infection or if the cause is a different problem. Sometimes, blood work is performed to see if your white blood cell count is elevated and if a germ (bacterial) or viral infection is present. Using this knowledge, an appropriate medicine which kills germs (antibiotic) can be chosen by your caregiver. A viral infection causing epididymitis will most often go away (resolve) without treatment. HOME CARE INSTRUCTIONS   Hot sitz baths for 20 minutes, 4 times per day, may help relieve pain.  Only take over-the-counter or prescription medicines for pain, discomfort or fever as directed by your caregiver.  Take all medicines, including antibiotics, as directed. Take the antibiotics for the full prescribed length of time even if you are feeling better.  It is very important to keep all follow-up appointments. SEEK IMMEDIATE MEDICAL CARE IF:   You have a fever.  You have pain not relieved with medicines.  You have any worsening of your problems.  Your pain seems to come and go.  You develop pain, redness, and swelling in the scrotum and surrounding areas. MAKE SURE YOU:   Understand these instructions.  Will watch your condition.  Will get help right away if you are not doing  well or get worse. Document Released: 01/18/2000 Document Revised: 04/14/2011 Document Reviewed: 12/07/2008 [facility] Patient Information 2014 [place], [place].

## 2013-07-19 NOTE — Care Management Note (Signed)
Pt was called by ED-RN on call back, and he told her he was not able to buy all his medications, due to costs. He did not get the Doxycycline. It was $40, and he did not have the money, after buying the others. He states he has no money left and no one left to borrow from. He also states he is having diarrhea x the past 2 days, and may need to come back to ED. He requests a voucher from medication assistance program, but would like to wait until am in case he does need to come back, the voucher can cover all new meds he is given, if any, since CM explained that program is confined to 1 voucher for assistance / year. Will call pt in am to check on diarrhea

## 2013-07-26 ENCOUNTER — Ambulatory Visit (INDEPENDENT_AMBULATORY_CARE_PROVIDER_SITE_OTHER): Payer: Self-pay | Admitting: Urology

## 2013-07-26 DIAGNOSIS — N509 Disorder of male genital organs, unspecified: Secondary | ICD-10-CM

## 2013-07-26 DIAGNOSIS — N453 Epididymo-orchitis: Secondary | ICD-10-CM

## 2013-07-26 DIAGNOSIS — N489 Disorder of penis, unspecified: Secondary | ICD-10-CM

## 2013-08-30 ENCOUNTER — Ambulatory Visit: Payer: Self-pay | Admitting: Urology

## 2013-09-22 ENCOUNTER — Encounter (HOSPITAL_COMMUNITY): Payer: Self-pay | Admitting: Emergency Medicine

## 2013-09-22 ENCOUNTER — Emergency Department (HOSPITAL_COMMUNITY)
Admission: EM | Admit: 2013-09-22 | Discharge: 2013-09-22 | Disposition: A | Discharge status: Home / Self Care | Payer: Self-pay | Attending: Emergency Medicine | Admitting: Emergency Medicine

## 2013-09-22 DIAGNOSIS — Z91040 Latex allergy status: Secondary | ICD-10-CM | POA: Insufficient documentation

## 2013-09-22 DIAGNOSIS — R109 Unspecified abdominal pain: Secondary | ICD-10-CM | POA: Insufficient documentation

## 2013-09-22 DIAGNOSIS — R079 Chest pain, unspecified: Secondary | ICD-10-CM | POA: Insufficient documentation

## 2013-09-22 DIAGNOSIS — N509 Disorder of male genital organs, unspecified: Secondary | ICD-10-CM | POA: Insufficient documentation

## 2013-09-22 DIAGNOSIS — R509 Fever, unspecified: Secondary | ICD-10-CM | POA: Insufficient documentation

## 2013-09-22 DIAGNOSIS — K047 Periapical abscess without sinus: Secondary | ICD-10-CM | POA: Insufficient documentation

## 2013-09-22 DIAGNOSIS — G8929 Other chronic pain: Secondary | ICD-10-CM | POA: Insufficient documentation

## 2013-09-22 DIAGNOSIS — H5710 Ocular pain, unspecified eye: Secondary | ICD-10-CM | POA: Insufficient documentation

## 2013-09-22 DIAGNOSIS — J3489 Other specified disorders of nose and nasal sinuses: Secondary | ICD-10-CM | POA: Insufficient documentation

## 2013-09-22 DIAGNOSIS — R112 Nausea with vomiting, unspecified: Secondary | ICD-10-CM | POA: Insufficient documentation

## 2013-09-22 DIAGNOSIS — Z87448 Personal history of other diseases of urinary system: Secondary | ICD-10-CM | POA: Insufficient documentation

## 2013-09-22 HISTORY — DX: Epididymitis: N45.1

## 2013-09-22 MED ORDER — PENICILLIN V POTASSIUM 500 MG PO TABS: 500.0000 mg | ORAL_TABLET | Freq: Four times a day (QID) | ORAL | Status: DC

## 2013-09-22 MED ORDER — PENICILLIN V POTASSIUM 250 MG PO TABS
500.0000 mg | ORAL_TABLET | Freq: Once | ORAL | Status: AC
  Administered 2013-09-22: 500 mg via ORAL
  Filled 2013-09-22: qty 2

## 2013-09-22 MED ORDER — HYDROCODONE-ACETAMINOPHEN 5-325 MG PO TABS: 1.0000 | ORAL_TABLET | Freq: Four times a day (QID) | ORAL | Status: DC | PRN

## 2013-09-22 MED ORDER — HYDROCODONE-ACETAMINOPHEN 5-325 MG PO TABS
1.0000 | ORAL_TABLET | Freq: Once | ORAL | Status: AC
  Administered 2013-09-22: 1 via ORAL
  Filled 2013-09-22: qty 1

## 2013-09-22 NOTE — Discharge Instructions (Signed)
Dental Abscess A dental abscess is a collection of infected fluid (pus) from a bacterial infection in the inner part of the tooth (pulp). It usually occurs at the end of the tooth's root.  CAUSES   Severe tooth decay.  Trauma to the tooth that allows bacteria to enter into the pulp, such as a broken or chipped tooth. SYMPTOMS   Severe pain in and around the infected tooth.  Swelling and redness around the abscessed tooth or in the mouth or face.  Tenderness.  Pus drainage.  Bad breath.  Bitter taste in the mouth.  Difficulty swallowing.  Difficulty opening the mouth.  Nausea.  Vomiting.  Chills.  Swollen neck glands. DIAGNOSIS   A medical and dental history will be taken.  An examination will be performed by tapping on the abscessed tooth.  X-rays may be taken of the tooth to identify the abscess. TREATMENT The goal of treatment is to eliminate the infection. You may be prescribed antibiotic medicine to stop the infection from spreading. A root canal may be performed to save the tooth. If the tooth cannot be saved, it may be pulled (extracted) and the abscess may be drained.  HOME CARE INSTRUCTIONS  Only take over-the-counter or prescription medicines for pain, fever, or discomfort as directed by your caregiver.  Rinse your mouth (gargle) often with salt water ( tsp salt in 8 oz [250 ml] of warm water) to relieve pain or swelling.  Do not drive after taking pain medicine (narcotics).  Do not apply heat to the outside of your face.  Return to your dentist for further treatment as directed. SEEK MEDICAL CARE IF:  Your pain is not helped by medicine.  Your pain is getting worse instead of better. SEEK IMMEDIATE MEDICAL CARE IF:  You have a fever or persistent symptoms for more than 2-3 days.  You have a fever and your symptoms suddenly get worse.  You have chills or a very bad headache.  You have problems breathing or swallowing.  You have trouble  opening your mouth.  You have swelling in the neck or around the eye. Document Released: 01/20/2005 Document Revised: 10/15/2011 Document Reviewed: 04/30/2010 [facility] Patient Information 2015 [place]ExitCare, [place]. This information is not intended to replace advice given to you by your health care provider. Make sure you discuss any questions you have with your health care provider.  Abscessed Tooth An abscessed tooth is an infection around your tooth. It may be caused by holes or damage to the tooth (cavity) or a dental disease. An abscessed tooth causes mild to very bad pain in and around the tooth. See your dentist right away if you have tooth or gum pain. HOME CARE  Take your medicine as told. Finish it even if you start to feel better.  Do not drive after taking pain medicine.  Rinse your mouth (gargle) often with salt water ( teaspoon salt in 8 ounces of warm water).  Do not apply heat to the outside of your face. GET HELP RIGHT AWAY IF:   You have a temperature by mouth above 102 F (38.9 C), not controlled by medicine.  You have chills and a very bad headache.  You have problems breathing or swallowing.  Your mouth will not open.  You develop puffiness (swelling) on the neck or around the eye.  Your pain is not helped by medicine.  Your pain is getting worse instead of better. MAKE SURE YOU:   Understand these instructions.  Will watch your  condition.  Will get help right away if you are not doing well or get worse. Document Released: 07/09/2007 Document Revised: 04/14/2011 Document Reviewed: 04/30/2010 [facility] Patient Information 2015 [place], [place]. This information is not intended to replace advice given to you by your health care provider. Make sure you discuss any questions you have with your health care provider.     Follow up with a dentist as planned. Also a discussion followed the urologist that has been following you for the epididymitis. Return for any  new or worse symptoms. Recommend stop taking the Motrin. Take hydrocodone as needed for pain take penicillin as directed.

## 2013-09-22 NOTE — ED Provider Notes (Signed)
CSN: [id number]     Arrival date & time 09/22/13  1825 History   First MD Initiated Contact with Patient 09/22/13 1837    This chart was scribed for Vanetta Mulders, MD by Marica Otter, ED Scribe. This patient was seen in room APA18/APA18 and the patient's care was started at 7:22 PM.  Chief Complaint  Patient presents with  . Fever   The history is provided by the patient. No language interpreter was used.   HPI Comments: PCP: No PCP Per Patient  Christian Schwartz is a 38 y.o. male, with medical Hx noted below, who presents to the Emergency Department complaining of an abscess to his lower tooth on right side onset 3 months ago. Pt also complains of associated ear pain and intermittent fever. Pt reports that he has a dentist and plans to get x-rays tomorrow. Pt states that he has been taking ibuprofen and motrin at home for his pain and states that motrin causes n/v/d. Pt states he has been having 5 bowel movements daily and having 5 episodes of emesis daily onset approximately 2-3 weeks ago. Pt denies being on any antibiotics presently. Pt denies allergy to penicillin. Pt notes allergies to: tramadol, latex, sulfa antibiotics, and zofran.   Chronic Medical Conditions:  Pt also complains of chronic abd pain onset years ago.   Pt further reports that he is being followed by Urology for epididymitis. Pt further notes that he has been on 2 courses on antibiotics for the same.    Past Medical History  Diagnosis Date  . Chronic back pain     hx of breaking back at age 73 and 5 years ago fell through roof  . Chronic abdominal pain   . Vitamin D deficiency   . Epididymitis, right    Past Surgical History  Procedure Laterality Date  . None     Family History  Problem Relation Age of Onset  . Colon cancer      adopted, unsure   History  Substance Use Topics  . Smoking status: Current Every Day Smoker -- 0.50 packs/day    Types: Cigarettes  . Smokeless tobacco: Not on file  . Alcohol  Use: Yes     Comment: former (since 5 years)    Review of Systems  Constitutional: Positive for fever.  HENT: Positive for dental problem (abscess to his lower tooth on right side), rhinorrhea and sinus pressure.   Eyes: Positive for pain. Negative for visual disturbance.  Respiratory: Positive for cough. Negative for shortness of breath.   Cardiovascular: Positive for chest pain. Negative for leg swelling.  Gastrointestinal: Positive for nausea, vomiting, abdominal pain (left sided) and diarrhea.  Genitourinary: Positive for testicular pain. Negative for dysuria.  Musculoskeletal: Positive for back pain (at baseline).  Skin: Negative for rash.  Neurological: Positive for headaches.  Hematological: Does not bruise/bleed easily.  Psychiatric/Behavioral: Negative for confusion.   Allergies  Tramadol; Latex; Sulfa antibiotics; and Zofran  Home Medications   Prior to Admission medications   Medication Sig Start Date End Date Taking? Authorizing Provider  ibuprofen (ADVIL,MOTRIN) 200 MG tablet Take 800 mg by mouth every 4 (four) hours as needed (pain).   Yes Historical Provider, MD  HYDROcodone-acetaminophen (NORCO/VICODIN) 5-325 MG per tablet Take 1-2 tablets by mouth every 6 (six) hours as needed. 09/22/13   Vanetta Mulders, MD  penicillin v potassium (VEETID) 500 MG tablet Take 1 tablet (500 mg total) by mouth 4 (four) times daily. 09/22/13   Vanetta Mulders, MD  Triage Vitals: BP 120/68  Pulse 66  Resp 16  SpO2 100% Physical Exam  Nursing note and vitals reviewed. Constitutional: He is oriented to person, place, and time. He appears well-developed and well-nourished. No distress.  HENT:  Head: Normocephalic and atraumatic.  Missing last 2 molars on right, lower side. Swelling under the mandible on the right side.   Eyes: Conjunctivae and EOM are normal.  Neck: Neck supple. No tracheal deviation present.  Cardiovascular: Normal rate and regular rhythm.   Pulmonary/Chest: Effort  normal. No respiratory distress.  Abdominal: There is no tenderness.  Musculoskeletal: Normal range of motion. He exhibits no edema.  Neurological: He is alert and oriented to person, place, and time.  Skin: Skin is warm and dry.  Psychiatric: He has a normal mood and affect. His behavior is normal.    ED Course  Procedures (including critical care time) DIAGNOSTIC STUDIES: Oxygen Saturation is 100% on RA, nl by my interpretation.    COORDINATION OF CARE:    Labs Review Labs Reviewed - No data to display  Imaging Review No results found.   EKG Interpretation None      MDM   Final diagnoses:  Abscessed tooth    Patient with the very poor dentition on the right side of his mouth. Probable abscessed tooth. Patient has followup with dentist in the next few days. X-ray is one of her x-rays tomorrow. We'll start him on penicillin first dose tonight and hydrocodone first dose tonight and prescription provided to continue. Patient's other symptoms are probably related to his chronic abdominal pain. For nausea vomiting and diarrhea could possibly be related to upset stomach from the large doses of Motrin. Patient is nontoxic no acute distress. No fevers here. No evidence of any significant maxillofacial abscess.  Patient has a history of epididymitis being followed by urology from [facility] he has been on 2 courses of antibiotics his had 2 appointments will be removed in another appointment since symptoms have not improved.  I personally performed the services described in this documentation, which was scribed in my presence. The recorded information has been reviewed and is accurate.     Vanetta MuldersScott Amariss Detamore, MD 09/22/13 2018

## 2013-09-22 NOTE — ED Notes (Signed)
Pt presents with multiple complaints. States that he has a wisdom tooth that has broken off and feels "like a hole going down in his jaw 8/10." He also has pain on his left lower abdomen 5/10. 3rd pain site is his testicle. He was diagnosed with epididymitis 6/15 in right testicle but feels like it has spread to left. He states that he finished all his antibiotics but that it still hurts intermittently 10/10. He has been vomiting and having diarrhea for more than one month. He states that he may have one good day per month with no diarrhea and vomiting, but the rest of the days he has multiple episodes of diarrhea and vomiting. NAD noted.

## 2013-09-22 NOTE — ED Notes (Signed)
Pt c/o n/v/d, stiff neck, and problem with abscess tooth x 3 months.

## 2013-10-02 ENCOUNTER — Encounter (HOSPITAL_COMMUNITY): Payer: Self-pay | Admitting: Emergency Medicine

## 2013-10-02 ENCOUNTER — Emergency Department (HOSPITAL_COMMUNITY): Payer: Self-pay

## 2013-10-02 ENCOUNTER — Emergency Department (HOSPITAL_COMMUNITY)
Admission: EM | Admit: 2013-10-02 | Discharge: 2013-10-02 | Disposition: A | Discharge status: Home / Self Care | Payer: Self-pay | Attending: Emergency Medicine | Admitting: Emergency Medicine

## 2013-10-02 DIAGNOSIS — G8911 Acute pain due to trauma: Secondary | ICD-10-CM | POA: Insufficient documentation

## 2013-10-02 DIAGNOSIS — Z91040 Latex allergy status: Secondary | ICD-10-CM | POA: Insufficient documentation

## 2013-10-02 DIAGNOSIS — G8929 Other chronic pain: Secondary | ICD-10-CM | POA: Insufficient documentation

## 2013-10-02 DIAGNOSIS — Z87448 Personal history of other diseases of urinary system: Secondary | ICD-10-CM | POA: Insufficient documentation

## 2013-10-02 DIAGNOSIS — S62309D Unspecified fracture of unspecified metacarpal bone, subsequent encounter for fracture with routine healing: Secondary | ICD-10-CM

## 2013-10-02 DIAGNOSIS — M79609 Pain in unspecified limb: Secondary | ICD-10-CM | POA: Insufficient documentation

## 2013-10-02 DIAGNOSIS — Z79899 Other long term (current) drug therapy: Secondary | ICD-10-CM | POA: Insufficient documentation

## 2013-10-02 DIAGNOSIS — F17200 Nicotine dependence, unspecified, uncomplicated: Secondary | ICD-10-CM | POA: Insufficient documentation

## 2013-10-02 MED ORDER — OXYCODONE-ACETAMINOPHEN 5-325 MG PO TABS
1.0000 | ORAL_TABLET | Freq: Once | ORAL | Status: AC
  Administered 2013-10-02: 1 via ORAL
  Filled 2013-10-02: qty 1

## 2013-10-02 MED ORDER — OXYCODONE-ACETAMINOPHEN 5-325 MG PO TABS: 1.0000 | ORAL_TABLET | ORAL | Status: DC | PRN

## 2013-10-02 NOTE — ED Provider Notes (Signed)
Medical screening examination/treatment/procedure(s) were performed by non-physician practitioner and as supervising physician I was immediately available for consultation/collaboration.   EKG Interpretation None        Zay Yeargan W Winston Misner, MD 10/02/13 2027 

## 2013-10-02 NOTE — ED Provider Notes (Signed)
CSN: [id number]     Arrival date & time 10/02/13  1829 History   First MD Initiated Contact with Patient 10/02/13 1848    This chart was scribed for nurse practitioner Karle Starch, NP working with Joya Gaskins, MD, by Andrew Au, ED Scribe. This patient was seen in room APFT22/APFT22 and the patient's care was started at 6:50 PM. Chief Complaint  Patient presents with  . Hand Injury   Patient is a 38 y.o. male presenting with hand injury. The history is provided by the patient. No language interpreter was used.  Hand Injury   GIACOMO VALONE is a 38 y.o. male who presents to the Emergency Department complaining of left hand pain onset 6 days. Pt was seen for a left hand injury at [facility]  and diagnosed with a boxers fracture to left hand. Pt states he has constant throbbing hand pain.  Pt reports he was unable to see orthopedist, Dr. Hilda Lias due to him not accepting his [employer] insurance. The splint is coming off and his hand is not staying in place like he thinks it should. Pt is requesting a referral to Dr. Romeo Apple because he has [facility] and he called [facility] office and they told him they did not accept that. Pt is allergic to abx, tramadol, sulfa and latex.      Past Medical History  Diagnosis Date  . Chronic back pain     hx of breaking back at age 89 and 5 years ago fell through roof  . Chronic abdominal pain   . Vitamin D deficiency   . Epididymitis, right    Past Surgical History  Procedure Laterality Date  . None     Family History  Problem Relation Age of Onset  . Colon cancer      adopted, unsure   History  Substance Use Topics  . Smoking status: Current Every Day Smoker -- 0.50 packs/day    Types: Cigarettes  . Smokeless tobacco: Not on file  . Alcohol Use: Yes     Comment: former (since 5 years)    Review of Systems  Musculoskeletal:       Left hand pain  All other systems negative   Allergies  Tramadol; Latex; Sulfa  antibiotics; and Zofran  Home Medications   Prior to Admission medications   Medication Sig Start Date End Date Taking? Authorizing Provider  HYDROcodone-acetaminophen (NORCO/VICODIN) 5-325 MG per tablet Take 1-2 tablets by mouth every 6 (six) hours as needed. 09/22/13   Vanetta Mulders, MD  ibuprofen (ADVIL,MOTRIN) 200 MG tablet Take 800 mg by mouth every 4 (four) hours as needed (pain).    Historical Provider, MD  penicillin v potassium (VEETID) 500 MG tablet Take 1 tablet (500 mg total) by mouth 4 (four) times daily. 09/22/13   Vanetta Mulders, MD   Triage Vitals- BP 129/74  Pulse 65  Temp(Src) 98.3 F (36.8 C) (Oral)  Resp 20  Ht  (1.778 m)  Wt 185 lb (83.915 kg)  BMI 26.54 kg/m2  SpO2 100% Physical Exam  Nursing note and vitals reviewed. Constitutional: He is oriented to person, place, and time. He appears well-developed and well-nourished. No distress.  HENT:  Head: Normocephalic and atraumatic.  Eyes: Conjunctivae and EOM are normal.  Neck: Neck supple.  Cardiovascular: Normal rate.   Good pulse. Adequate circulation.  Pulmonary/Chest: Effort normal.  Musculoskeletal: Normal range of motion.  Neurological: He is alert and oriented to person, place, and time.  Good touch sensation.   Skin: Skin is warm and dry.  Psychiatric: He has a normal mood and affect. His behavior is normal.    ED Course  Procedures DIAGNOSTIC STUDIES: Oxygen Saturation is 100% on RA, normal by my interpretation.    COORDINATION OF CARE: 6:50 PM- Pt advised of plan for treatment and pt agrees. X-Rays were stable since last reading. New splint will be applied.  Imaging Review Dg Hand Complete Left  10/02/2013   CLINICAL DATA:  Followup boxer's fracture  EXAM: LEFT HAND - COMPLETE 3+ VIEW  COMPARISON:  09/26/2013  FINDINGS: Fracture through the low distal left fifth metacarpal again noted with mild angulation and displacement. The appearance is stable since prior study. No additional acute  bony abnormality. Joint spaces are maintained.  IMPRESSION: Stable appearance of the at mildly angulated and displaced distal left fifth metacarpal fracture.   Electronically Signed   By: Charlett Nose M.D.   On: 10/02/2013 19:35     MDM   38 y.o. male her for follow up of fracture of the left hand 6 days ago. Splint that was applied at [facility] removed. Follow up x-ray done. Splint reapplied and ortho referral given and pain medication.  I have reviewed this patient's vital signs, nurses notes, appropriate labs and imaging.  I have discussed findings and plan of care with the patient and he voices understanding. Stable for discharge and remains neurovascularly intact.   I personally performed the services described in this documentation, which was scribed in my presence. The recorded information has been reviewed and is accurate.      Janne Napoleon, [place] 10/02/13 2008

## 2013-10-02 NOTE — Discharge Instructions (Signed)

## 2013-10-02 NOTE — ED Notes (Signed)
Pt reports injured left hand x1 week ago. Pt reports was seen and dx with left boxer fracture. Pt reports has not been able to f/u with orthopedic and reports increased pain and "cast falling apart." nad noted.

## 2013-10-04 ENCOUNTER — Telehealth: Payer: Self-pay | Admitting: Orthopedic Surgery

## 2013-10-04 NOTE — Telephone Encounter (Signed)
Patient stopped into office, request appointment, seen at [facility], initially at [facility] 1 week ago, and at [facility], 10/02/13, for problem of boxer fracture left hand.  Patient states he is under the [place] discount-brought a copy of his letter, which indicates he is due to re-apply for the discount.  He is aware, and is in process of re-applying; also aware of the need to obtain the initial records and films from [place] prior to scheduling.  Please review and advise.  Patient [phone]

## 2013-10-05 NOTE — Telephone Encounter (Signed)
Documented discount or cash when he comes

## 2013-10-11 NOTE — Telephone Encounter (Signed)
Spoke with patient, offered appointment for today, 10/11/13; states he has no transportation; scheduled for next available when he thinks he can get a ride, 10/17/13.  Aware of self-pay minimum if discount has not been re-approved.

## 2013-10-17 ENCOUNTER — Encounter (HOSPITAL_COMMUNITY): Payer: Self-pay | Admitting: Emergency Medicine

## 2013-10-17 ENCOUNTER — Ambulatory Visit: Payer: Self-pay | Admitting: Orthopedic Surgery

## 2013-10-17 ENCOUNTER — Emergency Department (HOSPITAL_COMMUNITY)
Admission: EM | Admit: 2013-10-17 | Discharge: 2013-10-17 | Disposition: A | Discharge status: Home / Self Care | Payer: Self-pay | Attending: Emergency Medicine | Admitting: Emergency Medicine

## 2013-10-17 DIAGNOSIS — Z792 Long term (current) use of antibiotics: Secondary | ICD-10-CM | POA: Insufficient documentation

## 2013-10-17 DIAGNOSIS — R062 Wheezing: Secondary | ICD-10-CM | POA: Insufficient documentation

## 2013-10-17 DIAGNOSIS — G8929 Other chronic pain: Secondary | ICD-10-CM | POA: Insufficient documentation

## 2013-10-17 DIAGNOSIS — F17200 Nicotine dependence, unspecified, uncomplicated: Secondary | ICD-10-CM | POA: Insufficient documentation

## 2013-10-17 DIAGNOSIS — Z87448 Personal history of other diseases of urinary system: Secondary | ICD-10-CM | POA: Insufficient documentation

## 2013-10-17 DIAGNOSIS — Z91040 Latex allergy status: Secondary | ICD-10-CM | POA: Insufficient documentation

## 2013-10-17 DIAGNOSIS — S6292XD Unspecified fracture of left wrist and hand, subsequent encounter for fracture with routine healing: Secondary | ICD-10-CM

## 2013-10-17 DIAGNOSIS — M25539 Pain in unspecified wrist: Secondary | ICD-10-CM | POA: Insufficient documentation

## 2013-10-17 DIAGNOSIS — IMO0001 Reserved for inherently not codable concepts without codable children: Secondary | ICD-10-CM | POA: Insufficient documentation

## 2013-10-17 DIAGNOSIS — Z880 Allergy status to penicillin: Secondary | ICD-10-CM | POA: Insufficient documentation

## 2013-10-17 DIAGNOSIS — M25532 Pain in left wrist: Secondary | ICD-10-CM

## 2013-10-17 MED ORDER — IBUPROFEN 800 MG PO TABS: 800.0000 mg | ORAL_TABLET | Freq: Three times a day (TID) | ORAL | Status: DC

## 2013-10-17 MED ORDER — HYDROCODONE-ACETAMINOPHEN 5-325 MG PO TABS: 1.0000 | ORAL_TABLET | ORAL | Status: DC | PRN

## 2013-10-17 NOTE — ED Notes (Signed)
Called patient to room. No answer.

## 2013-10-17 NOTE — ED Provider Notes (Signed)
CSN: [id number]     Arrival date & time 10/17/13  1914 History   First MD Initiated Contact with Patient 10/17/13 1054   This chart was scribed for non-physician practitioner Ivery Quale, PA-C working with Hilario Quarry, MD by Gwenevere Abbot, ED scribe. This patient was seen in room APFT21/APFT21 and the patient's care was started at 12:57 PM.    Chief Complaint  Patient presents with  . Follow-up   The history is provided by the patient. No language interpreter was used.   HPI Comments:  Christian Schwartz is a 38 y.o. male who presents to the Emergency Department complaining of discomfort of the left hand. Pt reports that he was supposed to see an orthopedist today for a 3 week check-up for pain stemming from a broken bone in his left hand, but was unable to see doctor due to an issue with paperwork and insurance He presents today for evaluation of his splint and assistance wth his pain..   Past Medical History  Diagnosis Date  . Chronic back pain     hx of breaking back at age 44 and 5 years ago fell through roof  . Chronic abdominal pain   . Vitamin D deficiency   . Epididymitis, right    Past Surgical History  Procedure Laterality Date  . None     Family History  Problem Relation Age of Onset  . Colon cancer      adopted, unsure   History  Substance Use Topics  . Smoking status: Current Every Day Smoker -- 0.50 packs/day    Types: Cigarettes  . Smokeless tobacco: Not on file  . Alcohol Use: Yes     Comment: former (since 5 years)    Review of Systems  Constitutional: Negative for fever and chills.  Musculoskeletal: Positive for arthralgias and myalgias.  Skin: Negative for rash.  All other systems reviewed and are negative.   Allergies  Penicillins; Tramadol; Latex; Sulfa antibiotics; and Zofran  Home Medications   Prior to Admission medications   Medication Sig Start Date End Date Taking? Authorizing Provider  ibuprofen (ADVIL,MOTRIN) 200 MG tablet Take 800 mg  by mouth every 4 (four) hours as needed (pain).   Yes Historical Provider, MD  oxyCODONE-acetaminophen (ROXICET) 5-325 MG per tablet Take 1 tablet by mouth every 4 (four) hours as needed for severe pain. 10/02/13  Yes Hope Orlene Och, NP  penicillin v potassium (VEETID) 500 MG tablet Take 1 tablet (500 mg total) by mouth 4 (four) times daily. 09/22/13  Yes Vanetta Mulders, MD   BP 125/68  Pulse 66  Temp(Src) 98.4 F (36.9 C) (Oral)  Resp 16  Ht  (1.778 m)  Wt 185 lb (83.915 kg)  BMI 26.54 kg/m2  SpO2 99% Physical Exam  Nursing note and vitals reviewed. Constitutional: He is oriented to person, place, and time. He appears well-developed and well-nourished.  HENT:  Head: Normocephalic and atraumatic.  Eyes: EOM are normal.  Neck: Normal range of motion. Neck supple.  Cardiovascular: Normal rate, regular rhythm and normal heart sounds.   Pulses:      Radial pulses are 2+ on the left side.  Pulmonary/Chest: Effort normal. He has no decreased breath sounds. He has wheezes. He has no rhonchi. He has no rales.  Symmetrical rise and fall of the chest. Scattered wheezes.  Musculoskeletal: Normal range of motion.  Ulnar splint on left upper extremity.  Capillary Less than 2 seconds. Radial Pulse is 2+. Brachial 2+. No  red streaking of the upper extremity. No effusion of the elbow, and the elbow is not hot. Good ROM of left shoulder. No effusion of the shoulder.   Neurological: He is alert and oriented to person, place, and time.  Skin: Skin is warm and dry.  Psychiatric: He has a normal mood and affect. His behavior is normal.    ED Course  Procedures  DIAGNOSTIC STUDIES: Oxygen Saturation is 99% on RA, normal by my interpretation.  COORDINATION OF CARE: 1:03 PM-Discussed treatment plan which includes **norco and ibuprofen prescription.*.  Labs Review Labs Reviewed - No data to display  Imaging Review No results found.   EKG Interpretation None      MDM  Vital signs are  within normal limits. Patient sustained a fracture to his left hand. He has been taking the ace wrap off of the splint from time to time. The patient states that he is out of his pain medication, and that he cannot see an orthopedic surgeon for another week or 2. Patient states he was told to come back to the emergency department for a referral to a different orthopedic physician. Splint to the left hand and wrist has been renewed.    Final diagnoses:  Left wrist pain  Hand fracture, left, with routine healing, subsequent encounter    *I have reviewed nursing notes, vital signs, and all appropriate lab and imaging results for this patient.**  *I personally performed the services described in this documentation, which was scribed in my presence. The recorded information has been reviewed and is accurate.   Kathie Dike, PA-C 10/17/13 [phone]

## 2013-10-17 NOTE — ED Notes (Signed)
Patient states "[facility], but I cannot follow up with the doctor in [place]. I called Dr Romeo Apple and he told me to come back to the ER to get a referral for him."

## 2013-10-17 NOTE — Discharge Instructions (Signed)
Cast or Splint Care IT IS IMPORTANT THAT YOU SEE THE ORTHOPEDIC SPECIALIST AS SOON AS POSSIBLE. KEEP THE LEFT ARM ABOVE YOUR HEART. NORCO MAY CAUSE DROWSINESS, USE WITH CAUTION. TAKE IBUPROFEN WITH FOOD.                                                                                                                     Casts and splints support injured limbs and keep bones from moving while they heal.  HOME CARE  Keep the cast or splint uncovered during the drying period.  A plaster cast can take 24 to 48 hours to dry.  A fiberglass cast will dry in less than 1 hour.  Do not rest the cast on anything harder than a pillow for 24 hours.  Do not put weight on your injured limb. Do not put pressure on the cast. Wait for your doctor's approval.  Keep the cast or splint dry.  Cover the cast or splint with a plastic bag during baths or wet weather.  If you have a cast over your chest and belly (trunk), take sponge baths until the cast is taken off.  If your cast gets wet, dry it with a towel or blow dryer. Use the cool setting on the blow dryer.  Keep your cast or splint clean. Wash a dirty cast with a damp cloth.  Do not put any objects under your cast or splint.  Do not scratch the skin under the cast with an object. If itching is a problem, use a blow dryer on a cool setting over the itchy area.  Do not trim or cut your cast.  Do not take out the padding from inside your cast.  Exercise your joints near the cast as told by your doctor.  Raise (elevate) your injured limb on 1 or 2 pillows for the first 1 to 3 days. GET HELP IF:  Your cast or splint cracks.  Your cast or splint is too tight or too loose.  You itch badly under the cast.  Your cast gets wet or has a soft spot.  You have a bad smell coming from the cast.  You get an object stuck under the cast.  Your skin around the cast becomes red or sore.  You have new or more pain after the cast is put on. GET HELP RIGHT  AWAY IF:  You have fluid leaking through the cast.  You cannot move your fingers or toes.  Your fingers or toes turn blue or white or are cool, painful, or puffy (swollen).  You have tingling or lose feeling (numbness) around the injured area.  You have bad pain or pressure under the cast.  You have trouble breathing or have shortness of breath.  You have chest pain. Document Released: 05/22/2010 Document Revised: 09/22/2012 Document Reviewed: 07/29/2012 [facility] Patient Information 2015 [place], [place]. This information is not intended to replace advice given to you by your health care provider. Make sure you discuss any questions you have  with your health care provider. ° °

## 2013-10-18 NOTE — ED Provider Notes (Signed)
History/physical exam/procedure(s) were performed by non-physician practitioner and as supervising physician I was immediately available for consultation/collaboration. I have reviewed all notes and am in agreement with care and plan.   Jerret Mcbane S Bessie Livingood, MD 10/18/13 2320 

## 2013-10-20 ENCOUNTER — Telehealth: Payer: Self-pay | Admitting: Orthopedic Surgery

## 2013-10-20 NOTE — Telephone Encounter (Signed)
Called back to follow up with patient regarding appointment 10/17/13, for problem of fractured hand, which he elected to cancel, as he states that he had spoken with [employer] billing/financial contact person, and was advised that his discount was no longer in effect, as of 10/13/13.  He was aware he could be seen as scheduled, with a self-pay minimum amount, which he had also been made aware of prior to the scheduled visit per speaking with our office on: 10/04/13, 10/11/13, 10/14/13, and on 10/17/13 prior to his appointment.  He stated on 10/17/13 that he is re-applying for the discount, and will need to postpone his appointment, as he is unable to make any payment.  His chart notes indicate that he also visited the Emergency Room at [facility] on this date, 10/17/13. As noted in previous phone note, he was initially treated at [facility], then at [facility] for this problem   He opted to re-schedule the appointment to 11/03/13, as he said he should have payment by the first of the month, in the event that his discount has not be re-activated.  I stressed the importance of orthopedic care, as the injury is already a few weeks out, and he has not yet been treated by an orthopedist.  He states he will be at this appointment, and will keep in touch regarding status of his discount.  His ph# is [phone].

## 2013-11-01 NOTE — Telephone Encounter (Signed)
Patient did re-schedule, per my follow up call, appointment is 11/02/13.

## 2013-11-02 ENCOUNTER — Ambulatory Visit (INDEPENDENT_AMBULATORY_CARE_PROVIDER_SITE_OTHER): Payer: Self-pay | Admitting: Orthopedic Surgery

## 2013-11-02 VITALS — Ht 70.0 in | Wt 185.0 lb

## 2013-11-02 DIAGNOSIS — S62309A Unspecified fracture of unspecified metacarpal bone, initial encounter for closed fracture: Secondary | ICD-10-CM

## 2013-11-02 MED ORDER — HYDROCODONE-ACETAMINOPHEN 5-325 MG PO TABS: 1.0000 | ORAL_TABLET | Freq: Four times a day (QID) | ORAL | Status: DC | PRN

## 2013-11-02 NOTE — Progress Notes (Signed)
New self pay   Chief complaint pain and fracture left fifth metacarpal  This is a 38 year old male he's had multiple injuries to his left hand since 09/26/2013 related to a fall. [facility] and been [facility] and given medication. He complains of pain and stiffness in the left hand which is constant hurts morning night and after activity. He describes a throbbing aching pain which is radiating and associated with stiffness of the metacarpophalangeal joint  He has an abscess tooth that he is getting treatment for he has fatigue ringing in the ears sinus problems dental problems cough abdominal pain temperature intolerance and seasonal allergy burning pain in the legs back pain gait problems joint pain limb pain  He is allergic to multiple medications including penicillin rash, sulfa rash and swelling, latex rash, tramadol with rash and swelling Zofran severe abdominal pain and he is also allergic to fiberglass splint  Family history of lung disease COPD emphysema pneumonia ulcers reflux heart disease hypertension heart attack stroke blood clots heart failure coronary artery disease peripheral vascular disease and arthritis  Social history no smoking and no drinking and denies street drug use  Past history of pneumonia multiple fractures multiple vertebrae fractured in his lumbar spine he denies any surgery and denies any current chronic medications  Ht 5\' 10"  (1.778 m)  Wt 185 lb (83.915 kg)  BMI 26.54 kg/m2  Left hand exam shows no deformity on passive range of motion test he is tender at the fracture site. His passive range of motion is 75% at the metacarpophalangeal joint is no instability muscle tone is normal without atrophy in the hand skin is intact his pulses good his temperature is hand is normal he has no lymphadenopathy in the extremity has normal sensation in the hand his mood is pleasant he is oriented x3 his affect is normal  his appearance is normal and his vital signs are stable  [facility] most recent one is on the disc there is angulation at the fracture site with callus on the volar side of the fracture.  At this point he doesn't have clinical deformity so no need for surgery. I question his overall reliability anyway.  Recommend active range of motion exercises pain medication and x-ray in 4 weeks.

## 2013-11-03 ENCOUNTER — Ambulatory Visit: Payer: Self-pay | Admitting: Orthopedic Surgery

## 2013-12-01 ENCOUNTER — Ambulatory Visit: Payer: Self-pay | Admitting: Orthopedic Surgery

## 2013-12-22 ENCOUNTER — Ambulatory Visit (INDEPENDENT_AMBULATORY_CARE_PROVIDER_SITE_OTHER): Payer: Self-pay

## 2013-12-22 ENCOUNTER — Ambulatory Visit (INDEPENDENT_AMBULATORY_CARE_PROVIDER_SITE_OTHER): Payer: Self-pay | Admitting: Orthopedic Surgery

## 2013-12-22 ENCOUNTER — Encounter: Payer: Self-pay | Admitting: Orthopedic Surgery

## 2013-12-22 VITALS — BP 121/74 | Ht 70.0 in | Wt 185.0 lb

## 2013-12-22 DIAGNOSIS — S62309D Unspecified fracture of unspecified metacarpal bone, subsequent encounter for fracture with routine healing: Secondary | ICD-10-CM

## 2013-12-22 NOTE — Patient Instructions (Signed)
Strengthening exercises  

## 2013-12-22 NOTE — Progress Notes (Signed)
Patient ID: Christian BanksJohnny F Schwartz, male   DOB: 09-05-1975, 38 y.o.   MRN: [id number] Chief Complaint  Patient presents with  . Follow-up    follow up and xray Left hand metacarpal fracture, DOI 09/26/13    The patient had a left fifth metacarpal fracture boxer's type fracture one of multiple fractures that he had over the years  He has regained full range of motion he says he has some weakness and pain radiating into his forearm but this has nothing to do with his hand as far as I can tell his fracture healed in on x-ray he has good motion without malalignment he is discharged

## 2013-12-26 ENCOUNTER — Emergency Department (HOSPITAL_COMMUNITY)
Admission: EM | Admit: 2013-12-26 | Discharge: 2013-12-26 | Disposition: A | Discharge status: Home / Self Care | Payer: Self-pay | Attending: Emergency Medicine | Admitting: Emergency Medicine

## 2013-12-26 ENCOUNTER — Encounter (HOSPITAL_COMMUNITY): Payer: Self-pay

## 2013-12-26 DIAGNOSIS — G8929 Other chronic pain: Secondary | ICD-10-CM | POA: Insufficient documentation

## 2013-12-26 DIAGNOSIS — K006 Disturbances in tooth eruption: Secondary | ICD-10-CM | POA: Insufficient documentation

## 2013-12-26 DIAGNOSIS — Z87448 Personal history of other diseases of urinary system: Secondary | ICD-10-CM | POA: Insufficient documentation

## 2013-12-26 DIAGNOSIS — Z720 Tobacco use: Secondary | ICD-10-CM | POA: Insufficient documentation

## 2013-12-26 DIAGNOSIS — R6883 Chills (without fever): Secondary | ICD-10-CM | POA: Insufficient documentation

## 2013-12-26 DIAGNOSIS — K047 Periapical abscess without sinus: Secondary | ICD-10-CM

## 2013-12-26 DIAGNOSIS — Z880 Allergy status to penicillin: Secondary | ICD-10-CM | POA: Insufficient documentation

## 2013-12-26 DIAGNOSIS — K029 Dental caries, unspecified: Secondary | ICD-10-CM

## 2013-12-26 DIAGNOSIS — Z8639 Personal history of other endocrine, nutritional and metabolic disease: Secondary | ICD-10-CM | POA: Insufficient documentation

## 2013-12-26 DIAGNOSIS — R61 Generalized hyperhidrosis: Secondary | ICD-10-CM | POA: Insufficient documentation

## 2013-12-26 MED ORDER — HYDROCODONE-ACETAMINOPHEN 5-325 MG PO TABS: 1.0000 | ORAL_TABLET | ORAL | Status: DC | PRN

## 2013-12-26 MED ORDER — HYDROCODONE-ACETAMINOPHEN 5-325 MG PO TABS
1.0000 | ORAL_TABLET | Freq: Once | ORAL | Status: AC
  Administered 2013-12-26: 1 via ORAL
  Filled 2013-12-26: qty 1

## 2013-12-26 MED ORDER — CLINDAMYCIN HCL 150 MG PO CAPS: 150.0000 mg | ORAL_CAPSULE | Freq: Four times a day (QID) | ORAL | Status: DC

## 2013-12-26 MED ORDER — CLINDAMYCIN HCL 150 MG PO CAPS
150.0000 mg | ORAL_CAPSULE | Freq: Once | ORAL | Status: AC
  Administered 2013-12-26: 150 mg via ORAL
  Filled 2013-12-26: qty 1

## 2013-12-26 NOTE — Discharge Instructions (Signed)
Dental Abscess °A dental abscess is a collection of infected fluid (pus) from a bacterial infection in the inner part of the tooth (pulp). It usually occurs at the end of the tooth's root.  °CAUSES  °· Severe tooth decay. °· Trauma to the tooth that allows bacteria to enter into the pulp, such as a broken or chipped tooth. °SYMPTOMS  °· Severe pain in and around the infected tooth. °· Swelling and redness around the abscessed tooth or in the mouth or face. °· Tenderness. °· Pus drainage. °· Bad breath. °· Bitter taste in the mouth. °· Difficulty swallowing. °· Difficulty opening the mouth. °· Nausea. °· Vomiting. °· Chills. °· Swollen neck glands. °DIAGNOSIS  °· A medical and dental history will be taken. °· An examination will be performed by tapping on the abscessed tooth. °· X-rays may be taken of the tooth to identify the abscess. °TREATMENT °The goal of treatment is to eliminate the infection. You may be prescribed antibiotic medicine to stop the infection from spreading. A root canal may be performed to save the tooth. If the tooth cannot be saved, it may be pulled (extracted) and the abscess may be drained.  °HOME CARE INSTRUCTIONS °· Only take over-the-counter or prescription medicines for pain, fever, or discomfort as directed by your caregiver. °· Rinse your mouth (gargle) often with salt water (¼ tsp salt in 8 oz [250 ml] of warm water) to relieve pain or swelling. °· Do not drive after taking pain medicine (narcotics). °· Do not apply heat to the outside of your face. °· Return to your dentist for further treatment as directed. °SEEK MEDICAL CARE IF: °· Your pain is not helped by medicine. °· Your pain is getting worse instead of better. °SEEK IMMEDIATE MEDICAL CARE IF: °· You have a fever or persistent symptoms for more than 2-3 days. °· You have a fever and your symptoms suddenly get worse. °· You have chills or a very bad headache. °· You have problems breathing or swallowing. °· You have trouble  opening your mouth. °· You have swelling in the neck or around the eye. °Document Released: 01/20/2005 Document Revised: 10/15/2011 Document Reviewed: 04/30/2010 °[employer]® Patient Information ©2015 [employer]. This information is not intended to replace advice given to you by your health care provider. Make sure you discuss any questions you have with your health care provider. ° ° °Complete your entire course of antibiotics as prescribed.  You  may use the hydrocodone for pain relief but do not drive within 4 hours of taking as this will make you drowsy.  Avoid applying heat or ice to this abscess area which can worsen your symptoms.  You may use warm salt water swish and spit treatment or half peroxide and water swish and spit after meals to keep this area clean as discussed.  Call the dentist listed above for further management of your symptoms. ° ° °

## 2013-12-26 NOTE — ED Notes (Signed)
Rt side dental pain x 3 days

## 2013-12-26 NOTE — ED Notes (Signed)
Rt side dental pain for 3 days. Says he has a broken tooth that needs to be extracted.

## 2013-12-26 NOTE — ED Provider Notes (Signed)
CSN: [id number]     Arrival date & time 12/26/13  1620 History  This chart was scribed for non-physician practitioner Burgess AmorJulie Keondre Markson, PA-C working with Vanetta MuldersScott Zackowski, MD by Littie Deedsichard Sun, ED Scribe. This patient was seen in room APFT23/APFT23 and the patient's care was started at 6:21 PM.      Chief Complaint  Patient presents with  . Dental Pain    The history is provided by the patient. No language interpreter was used.   HPI Comments: Christian Schwartz is a 38 y.o. male who presents to the Emergency Department complaining of right sided dental pain that began 3 days ago. Patient states he has a broken wisdom tooth that needs to be extracted. He also reports chills, diaphoresis, and bad taste with suspected drainage around the tooth. Patient has allergies to penicillin, amoxicillin, and sulfa antibiotics. He is considering getting dentures.  Patient does not have insurance currently.  He is currently trying to save up money for dental extractions (states he was told by the last dentist he saw he needed 8 extractions).    Past Medical History  Diagnosis Date  . Chronic back pain     hx of breaking back at age 38 and 5 years ago fell through roof  . Chronic abdominal pain   . Vitamin D deficiency   . Epididymitis, right    Past Surgical History  Procedure Laterality Date  . None     Family History  Problem Relation Age of Onset  . Colon cancer      adopted, unsure   History  Substance Use Topics  . Smoking status: Current Every Day Smoker -- 0.50 packs/day    Types: Cigarettes  . Smokeless tobacco: Not on file  . Alcohol Use: Yes     Comment: former (since 5 years)    Review of Systems  Constitutional: Positive for chills and diaphoresis.  HENT: Positive for dental problem. Negative for facial swelling and sore throat.   Eyes: Positive for pain.  Respiratory: Negative for shortness of breath.   Musculoskeletal: Negative for neck pain and neck stiffness.      Allergies   Amoxicillin; Penicillins; Tramadol; Latex; Sulfa antibiotics; and Zofran  Home Medications   Prior to Admission medications   Medication Sig Start Date End Date Taking? Authorizing Provider  clindamycin (CLEOCIN) 150 MG capsule Take 1 capsule (150 mg total) by mouth every 6 (six) hours. 12/26/13   Burgess AmorJulie Akif Weldy, PA-C  HYDROcodone-acetaminophen (NORCO/VICODIN) 5-325 MG per tablet Take 1 tablet by mouth every 4 (four) hours as needed. 12/26/13   Burgess AmorJulie Chantay Whitelock, PA-C  ibuprofen (ADVIL,MOTRIN) 200 MG tablet Take 800 mg by mouth every 4 (four) hours as needed (pain).    Historical Provider, MD  ibuprofen (ADVIL,MOTRIN) 800 MG tablet Take 1 tablet (800 mg total) by mouth 3 (three) times daily. Patient not taking: Reported on 12/26/2013 10/17/13   Kathie DikeHobson M Bryant, PA-C  oxyCODONE-acetaminophen (ROXICET) 5-325 MG per tablet Take 1 tablet by mouth every 4 (four) hours as needed for severe pain. Patient not taking: Reported on 12/26/2013 10/02/13   [facility] Orlene OchM Neese, NP  penicillin v potassium (VEETID) 500 MG tablet Take 1 tablet (500 mg total) by mouth 4 (four) times daily. Patient not taking: Reported on 12/26/2013 09/22/13   Vanetta MuldersScott Zackowski, MD   BP 114/53 mmHg  Pulse 58  Temp(Src) 99.1 F (37.3 C) (Oral)  Resp 18  Ht 5\' 10"  (1.778 m)  Wt 185 lb (83.915 kg)  BMI 26.54 kg/m2  SpO2 100% Physical Exam  Constitutional: He is oriented to person, place, and time. He appears well-developed and well-nourished. No distress.  HENT:  Head: Normocephalic and atraumatic.  Right Ear: Tympanic membrane and external ear normal.  Left Ear: Tympanic membrane and external ear normal.  Mouth/Throat: Oropharynx is clear and moist and mucous membranes are normal. No oral lesions. No trismus in the jaw. Dental abscesses present.  Generalized poor dentition with multiple cavities. His right lower third molar is fractured with surrounding gingival edema. No fluctuance to the edema. No facial swelling or erythema. Sublingual  space is soft. No trismus.  Eyes: Conjunctivae are normal.  Neck: Normal range of motion. Neck supple.  Cardiovascular: Normal rate and normal heart sounds.   Pulmonary/Chest: Effort normal.  Abdominal: He exhibits no distension.  Musculoskeletal: Normal range of motion.  Lymphadenopathy:    He has no cervical adenopathy.  Neurological: He is alert and oriented to person, place, and time.  Skin: Skin is warm and dry. No erythema.  Psychiatric: He has a normal mood and affect.  Nursing note and vitals reviewed.   ED Course  Procedures  DIAGNOSTIC STUDIES: Oxygen Saturation is 100% on room air, normal by my interpretation.    COORDINATION OF CARE: 6:28 PM-Discussed treatment plan which includes pain medication and abx with pt at bedside and pt agreed to plan.    Labs Review Labs Reviewed - No data to display  Imaging Review No results found.   EKG Interpretation None      MDM   Final diagnoses:  Dental abscess  Dental decay    Pt was prescribed clindamycin and hydrocodone. There are no drainable abscess appreciated.  He was given dental referrals including suggestion of checking out [facility] which may offer reasonable pricing.   I personally performed the services described in this documentation, which was scribed in my presence. The recorded information has been reviewed and is accurate.  The patient appears reasonably screened and/or stabilized for discharge and I doubt any other medical condition or other [facility] requiring further screening, evaluation, or treatment in the ED at this time prior to discharge.   Burgess AmorJulie Vienne Corcoran, PA-C 12/28/13 [id number]  Vanetta MuldersScott Zackowski, MD 01/02/14 [phone]

## 2014-02-10 ENCOUNTER — Ambulatory Visit: Payer: Self-pay | Admitting: Gastroenterology

## 2014-02-10 ENCOUNTER — Encounter: Payer: Self-pay | Admitting: Gastroenterology

## 2014-02-10 ENCOUNTER — Telehealth: Payer: Self-pay | Admitting: Gastroenterology

## 2014-02-10 NOTE — Telephone Encounter (Signed)
PATIENT WAS A NO SHOW 02/10/14 AND LETTER SENT °

## 2014-06-27 ENCOUNTER — Emergency Department (HOSPITAL_COMMUNITY)
Admission: EM | Admit: 2014-06-27 | Discharge: 2014-06-27 | Disposition: A | Discharge status: Home / Self Care | Payer: Self-pay | Attending: Emergency Medicine | Admitting: Emergency Medicine

## 2014-06-27 ENCOUNTER — Emergency Department (HOSPITAL_COMMUNITY): Payer: Self-pay

## 2014-06-27 ENCOUNTER — Encounter (HOSPITAL_COMMUNITY): Payer: Self-pay | Admitting: Cardiology

## 2014-06-27 DIAGNOSIS — G8929 Other chronic pain: Secondary | ICD-10-CM | POA: Insufficient documentation

## 2014-06-27 DIAGNOSIS — M79643 Pain in unspecified hand: Secondary | ICD-10-CM

## 2014-06-27 DIAGNOSIS — R059 Cough, unspecified: Secondary | ICD-10-CM

## 2014-06-27 DIAGNOSIS — Z91040 Latex allergy status: Secondary | ICD-10-CM | POA: Insufficient documentation

## 2014-06-27 DIAGNOSIS — Z8639 Personal history of other endocrine, nutritional and metabolic disease: Secondary | ICD-10-CM | POA: Insufficient documentation

## 2014-06-27 DIAGNOSIS — Z87891 Personal history of nicotine dependence: Secondary | ICD-10-CM | POA: Insufficient documentation

## 2014-06-27 DIAGNOSIS — M79641 Pain in right hand: Secondary | ICD-10-CM | POA: Insufficient documentation

## 2014-06-27 DIAGNOSIS — Z791 Long term (current) use of non-steroidal anti-inflammatories (NSAID): Secondary | ICD-10-CM | POA: Insufficient documentation

## 2014-06-27 DIAGNOSIS — Z880 Allergy status to penicillin: Secondary | ICD-10-CM | POA: Insufficient documentation

## 2014-06-27 DIAGNOSIS — J069 Acute upper respiratory infection, unspecified: Secondary | ICD-10-CM | POA: Insufficient documentation

## 2014-06-27 DIAGNOSIS — Z792 Long term (current) use of antibiotics: Secondary | ICD-10-CM | POA: Insufficient documentation

## 2014-06-27 DIAGNOSIS — Z79899 Other long term (current) drug therapy: Secondary | ICD-10-CM | POA: Insufficient documentation

## 2014-06-27 DIAGNOSIS — R05 Cough: Secondary | ICD-10-CM

## 2014-06-27 DIAGNOSIS — Z87438 Personal history of other diseases of male genital organs: Secondary | ICD-10-CM | POA: Insufficient documentation

## 2014-06-27 MED ORDER — IBUPROFEN 400 MG PO TABS
400.0000 mg | ORAL_TABLET | Freq: Once | ORAL | Status: AC
  Administered 2014-06-27: 400 mg via ORAL
  Filled 2014-06-27: qty 1

## 2014-06-27 MED ORDER — GUAIFENESIN 100 MG/5ML PO SOLN
10.0000 mL | Freq: Once | ORAL | Status: AC
  Administered 2014-06-27: 200 mg via ORAL
  Filled 2014-06-27: qty 5

## 2014-06-27 MED ORDER — GUAIFENESIN 100 MG/5ML PO SOLN
ORAL | Status: AC
  Filled 2014-06-27: qty 5

## 2014-06-27 NOTE — Discharge Instructions (Signed)
It was our pleasure to provide your ER care today - we hope that you feel better.  Your chest xray was read as showing no pneumonia - your symptoms are most likely the result of a viral upper respiratory illness which should run its course, and get better in the next few days.   No smoking, and avoid smoky areas/second hand smoke.   Take robitussin or mucinex as need for cough and symptom relief.  For hand, take motrin or aleve as need. Follow up with primary care doctor, and/or orthopedist in 1 week if symptoms fail to improve/resolve.  Return to ER if worse, new symptoms, high fevers, trouble breathing, persistent vomiting, other concern.     Cough, Adult  A cough is a reflex that helps clear your throat and airways. It can help heal the body or may be a reaction to an irritated airway. A cough may only last 2 or 3 weeks (acute) or may last more than 8 weeks (chronic).  CAUSES Acute cough:  Viral or bacterial infections. Chronic cough:  Infections.  Allergies.  Asthma.  Post-nasal drip.  Smoking.  Heartburn or acid reflux.  Some medicines.  Chronic lung problems (COPD).  Cancer. SYMPTOMS   Cough.  Fever.  Chest pain.  Increased breathing rate.  High-pitched whistling sound when breathing (wheezing).  Colored mucus that you cough up (sputum). TREATMENT   A bacterial cough may be treated with antibiotic medicine.  A viral cough must run its course and will not respond to antibiotics.  Your caregiver may recommend other treatments if you have a chronic cough. HOME CARE INSTRUCTIONS   Only take over-the-counter or prescription medicines for pain, discomfort, or fever as directed by your caregiver. Use cough suppressants only as directed by your caregiver.  Use a cold steam vaporizer or humidifier in your bedroom or home to help loosen secretions.  Sleep in a semi-upright position if your cough is worse at night.  Rest as needed.  Stop smoking if you  smoke. SEEK IMMEDIATE MEDICAL CARE IF:   You have pus in your sputum.  Your cough starts to worsen.  You cannot control your cough with suppressants and are losing sleep.  You begin coughing up blood.  You have difficulty breathing.  You develop pain which is getting worse or is uncontrolled with medicine.  You have a fever. MAKE SURE YOU:   Understand these instructions.  Will watch your condition.  Will get help right away if you are not doing well or get worse. Document Released: 07/19/2010 Document Revised: 04/14/2011 Document Reviewed: 07/19/2010 [facility] Patient Information 2015 [place], [place]. This information is not intended to replace advice given to you by your health care provider. Make sure you discuss any questions you have with your health care provider.    Upper Respiratory Infection, Adult An upper respiratory infection (URI) is also sometimes known as the common cold. The upper respiratory tract includes the nose, sinuses, throat, trachea, and bronchi. Bronchi are the airways leading to the lungs. Most people improve within 1 week, but symptoms can last up to 2 weeks. A residual cough may last even longer.  CAUSES Many different viruses can infect the tissues lining the upper respiratory tract. The tissues become irritated and inflamed and often become very moist. Mucus production is also common. A cold is contagious. You can easily spread the virus to others by oral contact. This includes kissing, sharing a glass, coughing, or sneezing. Touching your mouth or nose and then touching  a surface, which is then touched by another person, can also spread the virus. SYMPTOMS  Symptoms typically develop 1 to 3 days after you come in contact with a cold virus. Symptoms vary from person to person. They may include:  Runny nose.  Sneezing.  Nasal congestion.  Sinus irritation.  Sore throat.  Loss of voice (laryngitis).  Cough.  Fatigue.  Muscle  aches.  Loss of appetite.  Headache.  Low-grade fever. DIAGNOSIS  You might diagnose your own cold based on familiar symptoms, since most people get a cold 2 to 3 times a year. Your caregiver can confirm this based on your exam. Most importantly, your caregiver can check that your symptoms are not due to another disease such as strep throat, sinusitis, pneumonia, asthma, or epiglottitis. Blood tests, throat tests, and X-rays are not necessary to diagnose a common cold, but they may sometimes be helpful in excluding other more serious diseases. Your caregiver will decide if any further tests are required. RISKS AND COMPLICATIONS  You may be at risk for a more severe case of the common cold if you smoke cigarettes, have chronic heart disease (such as heart failure) or lung disease (such as asthma), or if you have a weakened immune system. The very young and very old are also at risk for more serious infections. Bacterial sinusitis, middle ear infections, and bacterial pneumonia can complicate the common cold. The common cold can worsen asthma and chronic obstructive pulmonary disease (COPD). Sometimes, these complications can require emergency medical care and may be life-threatening. PREVENTION  The best way to protect against getting a cold is to practice good hygiene. Avoid oral or hand contact with people with cold symptoms. Wash your hands often if contact occurs. There is no clear evidence that vitamin C, vitamin E, echinacea, or exercise reduces the chance of developing a cold. However, it is always recommended to get plenty of rest and practice good nutrition. TREATMENT  Treatment is directed at relieving symptoms. There is no cure. Antibiotics are not effective, because the infection is caused by a virus, not by bacteria. Treatment may include:  Increased fluid intake. Sports drinks offer valuable electrolytes, sugars, and fluids.  Breathing heated mist or steam (vaporizer or  shower).  Eating chicken soup or other clear broths, and maintaining good nutrition.  Getting plenty of rest.  Using gargles or lozenges for comfort.  Controlling fevers with ibuprofen or acetaminophen as directed by your caregiver.  Increasing usage of your inhaler if you have asthma. Zinc gel and zinc lozenges, taken in the first 24 hours of the common cold, can shorten the duration and lessen the severity of symptoms. Pain medicines may help with fever, muscle aches, and throat pain. A variety of non-prescription medicines are available to treat congestion and runny nose. Your caregiver can make recommendations and may suggest nasal or lung inhalers for other symptoms.  HOME CARE INSTRUCTIONS   Only take over-the-counter or prescription medicines for pain, discomfort, or fever as directed by your caregiver.  Use a warm mist humidifier or inhale steam from a shower to increase air moisture. This may keep secretions moist and make it easier to breathe.  Drink enough water and fluids to keep your urine clear or pale yellow.  Rest as needed.  Return to work when your temperature has returned to normal or as your caregiver advises. You may need to stay home longer to avoid infecting others. You can also use a face mask and careful hand washing to  prevent spread of the virus. SEEK MEDICAL CARE IF:   After the first few days, you feel you are getting worse rather than better.  You need your caregiver's advice about medicines to control symptoms.  You develop chills, worsening shortness of breath, or brown or red sputum. These may be signs of pneumonia.  You develop yellow or brown nasal discharge or pain in the face, especially when you bend forward. These may be signs of sinusitis.  You develop a fever, swollen neck glands, pain with swallowing, or white areas in the back of your throat. These may be signs of strep throat. SEEK IMMEDIATE MEDICAL CARE IF:   You have a fever.  You  develop severe or persistent headache, ear pain, sinus pain, or chest pain.  You develop wheezing, a prolonged cough, cough up blood, or have a change in your usual mucus (if you have chronic lung disease).  You develop sore muscles or a stiff neck. Document Released: 07/16/2000 Document Revised: 04/14/2011 Document Reviewed: 04/27/2013 [facility] Patient Information 2015 [place], [place]. This information is not intended to replace advice given to you by your health care provider. Make sure you discuss any questions you have with your health care provider.     Intermetacarpal Sprain The intermetacarpal ligaments run between the knuckles at the base of the fingers. These ligaments are vulnerable to sprain and injury in which the ligament becomes overstretched or torn. Intermetacarpal sprains are classified into 3 categories. Grade 1 sprains cause pain, but the tendon is not lengthened. Grade 2 sprains include a lengthened ligament, due to the ligament being stretched or partially ruptured. With grade 2 sprains there is still function, although function may be decreased. Grade 3 sprains include a complete tear of the ligament, and the joint usually displays a loss of function.  SYMPTOMS   Severe pain at the time of injury.  Often, a feeling of popping or tearing inside the hand.  Tenderness and inflammation at the knuckles.  Bruising within a couple days of injury.  Impaired ability to use the hand. CAUSES  This condition occurs when the intermetacarpal ligaments are subjected to a greater stress than they can handle. This causes the ligaments to become stretched or torn. RISK INCREASES WITH:  Previous hand injury.  Fighting sports (boxing, wrestling, martial arts).  Sports in which you could fall on an outstretched hand (soccer, basketball, volleyball).  Other sports with repeated hand trauma (water polo, gymnastics).  Poor hand strength and flexibility.  Inadequate or poorly  fitted protective equipment. PREVENTION   Warm up and stretch properly before activity.  Maintain appropriate conditioning:  Hand flexibility.  Muscle strength and endurance.  Applying tape, protective strapping, or a brace may help prevent injury.  Provide the hand with support during sports and practice activities for 6 to 12 months following injury. PROGNOSIS  With proper treatment, healing should occur without impairment. The length of healing varies from 2 to 12 weeks, depending on the severity of injury. RELATED COMPLICATIONS   Longer healing time, if activities are resumed too soon.  Recurring symptoms or repeated injury, resulting in a chronic problem.  Injury to other nearby structures (bone, cartilage, tendon).  Arthritis of the knuckle (intermetacarpal) joint, with repeated sprains.  Prolonged disability (sometimes).  Hand and finger stiffness or weakness. TREATMENT Treatment first involves ice and medicine to reduce pain and inflammation. An elastic compression bandage may be worn to reduce discomfort and to protect the area. Depending on the severity of injury, you  may be required to restrain the area with a cast, splint, or brace. After the ligament has been allowed to heal, strengthening and stretching exercises may be needed to regain strength and a full range of motion. Exercises may be completed at home or with a therapist. Surgery is rarely needed. MEDICATION   If pain medicine is needed, nonsteroidal anti-inflammatory medicines (aspirin and ibuprofen), or other minor pain relievers (acetaminophen), are often advised.  Do not take pain medicine for 7 days before surgery.  Stronger pain relievers may be prescribed if your caregiver thinks they are needed. Use only as directed and only as much as you need. HEAT AND COLD  Cold treatment (icing) should be applied for 10 to 15 minutes every 2 to 3 hours for inflammation and pain, and immediately after activity  that aggravates your symptoms. Use ice packs or an ice massage.  Heat treatment may be used before performing stretching and strengthening activities prescribed by your caregiver, physical therapist, or athletic trainer. Use a heat pack or a warm water soak. SEEK MEDICAL CARE IF:   Symptoms remain or get worse, despite treatment for longer than 2 to 4 weeks.  You experience pain, numbness, discoloration, or coldness in the hand or fingers.  You develop blue, gray, or dark fingernails.  Any of the following occur after surgery: increased pain, swelling, redness, drainage of fluids, bleeding in the affected area, or signs of infection, including fever.  New, unexplained symptoms develop. (Drugs used in treatment may produce side effects.) Document Released: 01/20/2005 Document Revised: 06/06/2013 Document Reviewed: 05/04/2008 [facility] Patient Information 2015 [place]. This information is not intended to replace advice given to you by your health care provider. Make sure you discuss any questions you have with your health care provider.

## 2014-06-27 NOTE — ED Notes (Addendum)
Fever and cough  For 2 days.  Vomiting today.   States he also hurt his right hand today,  His knee went out today and he went to grab something and hurt his right hand.

## 2014-06-27 NOTE — ED Notes (Signed)
Patient in xray 

## 2014-06-27 NOTE — ED Provider Notes (Signed)
CSN: [id number]     Arrival date & time 06/27/14  1618 History   First MD Initiated Contact with Patient 06/27/14 1813     No chief complaint on file.    (Consider location/radiation/quality/duration/timing/severity/associated sxs/prior Treatment) The history is provided by the patient.  Patient c/o productive cough for the past 2-3 days. Episodic, persistent, moderate. States occasionally brings up yellowish sputum. No sore throat. +nasal congestion and drainage. No headaches. No sinus pain. No known ill contacts. Nausea. No vomiting or diarrhea. No abd pain, x if has coughing spell, then transient pain. Pt also states today slipped and tried to grab onto something with right hand, c/o right hand pain since, moderate, in region 3rd -4th metacarpal. Skin intact. No numbness. Denies fever or chills. No chest pain. +smoker.       Past Medical History  Diagnosis Date  . Chronic back pain     hx of breaking back at age 39 and 5 years ago fell through roof  . Chronic abdominal pain   . Vitamin D deficiency   . Epididymitis, right    Past Surgical History  Procedure Laterality Date  . None     Family History  Problem Relation Age of Onset  . Colon cancer      adopted, unsure   History  Substance Use Topics  . Smoking status: Former Smoker -- 0.50 packs/day    Types: Cigarettes  . Smokeless tobacco: Not on file  . Alcohol Use: No     Comment: former (since 5 years)    Review of Systems  Constitutional: Negative for fever and chills.  HENT: Positive for congestion. Negative for sore throat.   Eyes: Negative for redness.  Respiratory: Positive for cough. Negative for shortness of breath.   Cardiovascular: Negative for chest pain and leg swelling.  Gastrointestinal: Negative for vomiting and diarrhea.  Genitourinary: Negative for dysuria and flank pain.  Musculoskeletal: Negative for back pain and neck pain.  Skin: Negative for rash.  Neurological: Negative for headaches.   Hematological: Does not bruise/bleed easily.  Psychiatric/Behavioral: Negative for confusion.      Allergies  Amoxicillin; Penicillins; Tramadol; Latex; Sulfa antibiotics; and Zofran  Home Medications   Prior to Admission medications   Medication Sig Start Date End Date Taking? Authorizing Provider  ibuprofen (ADVIL,MOTRIN) 200 MG tablet Take 800 mg by mouth every 4 (four) hours as needed (pain).   Yes Historical Provider, MD  Misc Natural Products (NF FORMULAS TESTOSTERONE PO) Take 1 tablet by mouth daily. AGELESS MALE-BOOST FREE TESTOSTERONE   Yes Historical Provider, MD  clindamycin (CLEOCIN) 150 MG capsule Take 1 capsule (150 mg total) by mouth every 6 (six) hours. Patient not taking: Reported on 06/27/2014 12/26/13   Burgess AmorJulie Idol, PA-C  HYDROcodone-acetaminophen (NORCO/VICODIN) 5-325 MG per tablet Take 1 tablet by mouth every 4 (four) hours as needed. Patient not taking: Reported on 06/27/2014 12/26/13   Burgess AmorJulie Idol, PA-C  ibuprofen (ADVIL,MOTRIN) 800 MG tablet Take 1 tablet (800 mg total) by mouth 3 (three) times daily. Patient not taking: Reported on 12/26/2013 10/17/13   Ivery QualeHobson Bryant, PA-C  oxyCODONE-acetaminophen (ROXICET) 5-325 MG per tablet Take 1 tablet by mouth every 4 (four) hours as needed for severe pain. Patient not taking: Reported on 12/26/2013 10/02/13   [facility] Orlene OchM Neese, NP  penicillin v potassium (VEETID) 500 MG tablet Take 1 tablet (500 mg total) by mouth 4 (four) times daily. Patient not taking: Reported on 12/26/2013 09/22/13   Vanetta MuldersScott Zackowski, MD   BP  128/69 mmHg  Pulse 92  Temp(Src) 98.5 F (36.9 C) (Oral)  Resp 18  Ht  (1.778 m)  Wt 190 lb (86.183 kg)  BMI 27.26 kg/m2  SpO2 100% Physical Exam  Constitutional: He is oriented to person, place, and time. He appears well-developed and well-nourished. No distress.  HENT:  Mouth/Throat: Oropharynx is clear and moist.  Nasal congestion.   Eyes: Pupils are equal, round, and reactive to light.  Neck: Normal  range of motion. Neck supple. No tracheal deviation present.  No stiffness or rigidity  Cardiovascular: Normal rate, regular rhythm, normal heart sounds and intact distal pulses.  Exam reveals no gallop and no friction rub.   No murmur heard. Pulmonary/Chest: Effort normal and breath sounds normal. No accessory muscle usage. No respiratory distress.  Abdominal: Soft. Bowel sounds are normal. He exhibits no distension. There is no tenderness.  Musculoskeletal: Normal range of motion. He exhibits no edema.  +tenderness right mid hand in region mid to distal 3rd and 4th metacarpals. Skin intact. No signif sts noted. Normal rom digits. Normal cap refill distally.   Lymphadenopathy:    He has no cervical adenopathy.  Neurological: He is alert and oriented to person, place, and time.  Skin: Skin is warm and dry. He is not diaphoretic.  Psychiatric: He has a normal mood and affect.  Nursing note and vitals reviewed.   ED Course  Procedures (including critical care time) Labs Review Dg Chest 2 View  06/27/2014   CLINICAL DATA:  Fever, productive cough for 2 days  EXAM: CHEST  2 VIEW  COMPARISON:  12/04/2010  FINDINGS: Cardiomediastinal silhouette is stable. No acute infiltrate or pleural effusion. No pulmonary edema. Stable mild compression deformity mid thoracic spine.  IMPRESSION: No active cardiopulmonary disease.   Electronically Signed   By: Natasha Mead M.D.   On: 06/27/2014 19:08   Dg Hand Complete Right  06/27/2014   CLINICAL DATA:  Fever, productive cough for 2 days, fell on the right hand  EXAM: RIGHT HAND - COMPLETE 3+ VIEW  COMPARISON:  None.  FINDINGS: Three views of the right hand submitted. No acute fracture or subluxation. No radiopaque foreign body.  IMPRESSION: Negative.   Electronically Signed   By: Natasha Mead M.D.   On: 06/27/2014 19:09       MDM   Xrays.  Reviewed nursing notes and prior charts for additional history.   cxr neg acute. Given cough, nasal congestion, feel  most likely viral syndrome.  Hand xray neg. Tendon fxn intact. pcp f/u.  Recheck, no increased wob, pulse ox 100%, pt currently appears stable for d/c.      Cathren Laine, MD 06/27/14 1924

## 2014-10-02 ENCOUNTER — Emergency Department (HOSPITAL_COMMUNITY)
Admission: EM | Admit: 2014-10-02 | Discharge: 2014-10-03 | Disposition: A | Discharge status: Home / Self Care | Payer: Self-pay | Attending: Emergency Medicine | Admitting: Emergency Medicine

## 2014-10-02 ENCOUNTER — Encounter (HOSPITAL_COMMUNITY): Payer: Self-pay | Admitting: *Deleted

## 2014-10-02 DIAGNOSIS — R3919 Other difficulties with micturition: Secondary | ICD-10-CM | POA: Insufficient documentation

## 2014-10-02 DIAGNOSIS — Z8639 Personal history of other endocrine, nutritional and metabolic disease: Secondary | ICD-10-CM | POA: Insufficient documentation

## 2014-10-02 DIAGNOSIS — R55 Syncope and collapse: Secondary | ICD-10-CM | POA: Insufficient documentation

## 2014-10-02 DIAGNOSIS — R42 Dizziness and giddiness: Secondary | ICD-10-CM | POA: Insufficient documentation

## 2014-10-02 DIAGNOSIS — K088 Other specified disorders of teeth and supporting structures: Secondary | ICD-10-CM | POA: Insufficient documentation

## 2014-10-02 DIAGNOSIS — R197 Diarrhea, unspecified: Secondary | ICD-10-CM | POA: Insufficient documentation

## 2014-10-02 DIAGNOSIS — Z91040 Latex allergy status: Secondary | ICD-10-CM | POA: Insufficient documentation

## 2014-10-02 DIAGNOSIS — R112 Nausea with vomiting, unspecified: Secondary | ICD-10-CM | POA: Insufficient documentation

## 2014-10-02 DIAGNOSIS — Z87438 Personal history of other diseases of male genital organs: Secondary | ICD-10-CM | POA: Insufficient documentation

## 2014-10-02 DIAGNOSIS — Z880 Allergy status to penicillin: Secondary | ICD-10-CM | POA: Insufficient documentation

## 2014-10-02 DIAGNOSIS — K0889 Other specified disorders of teeth and supporting structures: Secondary | ICD-10-CM

## 2014-10-02 DIAGNOSIS — G8929 Other chronic pain: Secondary | ICD-10-CM | POA: Insufficient documentation

## 2014-10-02 DIAGNOSIS — K029 Dental caries, unspecified: Secondary | ICD-10-CM | POA: Insufficient documentation

## 2014-10-02 DIAGNOSIS — Z720 Tobacco use: Secondary | ICD-10-CM | POA: Insufficient documentation

## 2014-10-02 MED ORDER — SODIUM CHLORIDE 0.9 % IV BOLUS (SEPSIS)
1000.0000 mL | Freq: Once | INTRAVENOUS | Status: AC
  Administered 2014-10-03: 1000 mL via INTRAVENOUS

## 2014-10-02 MED ORDER — LOPERAMIDE HCL 2 MG PO CAPS
4.0000 mg | ORAL_CAPSULE | Freq: Once | ORAL | Status: AC
  Administered 2014-10-03: 4 mg via ORAL
  Filled 2014-10-02: qty 2

## 2014-10-02 NOTE — ED Notes (Signed)
Pt c/o diarrhea x 3 days; pt has multiple complaints

## 2014-10-02 NOTE — ED Provider Notes (Signed)
CSN: [id number]     Arrival date & time 10/02/14  2234 History   First MD Initiated Contact with Patient 10/02/14 2327     Chief Complaint  Patient presents with  . Diarrhea     (Consider location/radiation/quality/duration/timing/severity/associated sxs/prior Treatment) HPI  This 39 year old male who presents with diarrhea and near syncope. Patient reports he has had ongoing issues with dental infections. He is waiting to see a dentist. He states after the last 2-3 days he has had multiple episodes of diarrhea and generalized weakness. He states he is having difficulty getting out of bed. He reports headache. No known fevers. He also reports vomiting. No sick contacts. Decreased by mouth intake. Prior to arrival he was trying to urinate and felt like he might pass out. He states he got hot and diaphoretic. Patient states "I thought I was having a heart attack." He did not have any chest pain or shortness of breath that time. He has had difficulty urinating today and has only had minimal urinary output.  Past Medical History  Diagnosis Date  . Chronic back pain     hx of breaking back at age 31 and 5 years ago fell through roof  . Chronic abdominal pain   . Vitamin D deficiency   . Epididymitis, right    Past Surgical History  Procedure Laterality Date  . None     Family History  Problem Relation Age of Onset  . Colon cancer      adopted, unsure   Social History  Substance Use Topics  . Smoking status: Current Some Day Smoker -- 0.50 packs/day    Types: Cigarettes  . Smokeless tobacco: None  . Alcohol Use: No     Comment: former (since 5 years)    Review of Systems  Constitutional: Negative.  Negative for fever.  Respiratory: Negative.  Negative for chest tightness and shortness of breath.   Cardiovascular: Negative.  Negative for chest pain.  Gastrointestinal: Positive for nausea, vomiting, abdominal pain and diarrhea.  Genitourinary: Positive for difficulty urinating.  Negative for dysuria.  Musculoskeletal: Negative for back pain.  Neurological: Positive for light-headedness. Negative for syncope and headaches.  All other systems reviewed and are negative.     Allergies  Amoxicillin; Penicillins; Tramadol; Aspirin; Latex; Naproxen; Nsaids; Sulfa antibiotics; and Zofran  Home Medications   Prior to Admission medications   Medication Sig Start Date End Date Taking? Authorizing Provider  acetaminophen (TYLENOL) 500 MG tablet Take 1 tablet (500 mg total) by mouth every 6 (six) hours as needed. 10/03/14   Shon Baton, MD  clindamycin (CLEOCIN) 150 MG capsule Take 1 capsule (150 mg total) by mouth every 6 (six) hours. 10/03/14   Shon Baton, MD  HYDROcodone-acetaminophen (NORCO/VICODIN) 5-325 MG per tablet Take 1 tablet by mouth every 4 (four) hours as needed. Patient not taking: Reported on 06/27/2014 12/26/13   [name], PA-C  loperamide (IMODIUM) 2 MG capsule Take 1 capsule (2 mg total) by mouth 4 (four) times daily as needed for diarrhea or loose stools. 10/03/14   Shon Baton, MD  Misc Natural Products (NF FORMULAS TESTOSTERONE PO) Take 1 tablet by mouth daily. AGELESS MALE-BOOST FREE TESTOSTERONE    Historical Provider, MD  oxyCODONE-acetaminophen (ROXICET) 5-325 MG per tablet Take 1 tablet by mouth every 4 (four) hours as needed for severe pain. Patient not taking: Reported on 12/26/2013 10/02/13   [facility] Orlene Och, NP  penicillin v potassium (VEETID) 500 MG tablet Take 1 tablet (500  mg total) by mouth 4 (four) times daily. Patient not taking: Reported on 12/26/2013 09/22/13   Vanetta Mulders, MD   BP 99/47 mmHg  Pulse 55  Temp(Src) 98.1 F (36.7 C) (Oral)  Resp 14  Ht  (1.778 m)  Wt 175 lb (79.379 kg)  BMI 25.11 kg/m2  SpO2 98% Physical Exam  Constitutional: He is oriented to person, place, and time. He appears well-developed and well-nourished. No distress.  HENT:  Head: Normocephalic and atraumatic.  Mucous membranes  dry Generally poor dentition, multiple dental caries, no obvious abscess  Cardiovascular: Normal rate, regular rhythm and normal heart sounds.   No murmur heard. Pulmonary/Chest: Effort normal and breath sounds normal. No respiratory distress. He has no wheezes.  Abdominal: Soft. Bowel sounds are normal. There is no tenderness. There is no rebound.  Musculoskeletal: He exhibits no edema.  Neurological: He is alert and oriented to person, place, and time.  Skin: Skin is warm and dry.  Psychiatric: He has a normal mood and affect.  Nursing note and vitals reviewed.   ED Course  Procedures (including critical care time) Labs Review Labs Reviewed  CBC WITH DIFFERENTIAL/PLATELET - Abnormal; Notable for the following:    WBC 15.5 (*)    Neutro Abs 11.7 (*)    All other components within normal limits  COMPREHENSIVE METABOLIC PANEL - Abnormal; Notable for the following:    Potassium 3.4 (*)    Chloride 97 (*)    Glucose, Bld 101 (*)    Total Protein 8.2 (*)    All other components within normal limits  URINALYSIS, ROUTINE W REFLEX MICROSCOPIC (NOT AT [facility]) - Abnormal; Notable for the following:    Hgb urine dipstick TRACE (*)    All other components within normal limits  URINE MICROSCOPIC-ADD ON - Abnormal; Notable for the following:    Bacteria, UA FEW (*)    Crystals CA OXALATE CRYSTALS (*)    All other components within normal limits  TROPONIN I    Imaging Review Dg Abd Acute W/chest  10/03/2014   CLINICAL DATA:  Bilateral flank pain, chills, weakness  EXAM: DG ABDOMEN ACUTE W/ 1V CHEST  COMPARISON:  06/27/2014  FINDINGS: There is no evidence of dilated bowel loops or free intraperitoneal air. No radiopaque calculi or other significant radiographic abnormality is seen. Heart size and mediastinal contours are within normal limits. Both lungs are clear.  IMPRESSION: Negative abdominal radiographs.  No acute cardiopulmonary disease.   Electronically Signed   By: Ellery Plunk M.D.    On: 10/03/2014 01:03   I have personally reviewed and evaluated these images and lab results as part of my medical decision-making.   EKG Interpretation   Date/Time:  Tuesday October 03 2014 01:35:14 EDT Ventricular Rate:  52 PR Interval:  123 QRS Duration: 104 QT Interval:  440 QTC Calculation: 409 R Axis:   84 Text Interpretation:  Sinus rhythm Confirmed by Lareina Espino  MD, Toni Amend  ([id number]) on 10/03/2014 1:38:35 AM      MDM   Final diagnoses:  Near syncope  Diarrhea  Pain, dental    Patient presents with multiple complaints including diarrhea, near syncope, decreased urination, and dental pain. Nontoxic on exam. Vital signs are reassuring.  Nontoxic and nonfocal on exam. Lab work obtained.  Mild leukocytosis but otherwise unremarkable. Patient is not orthostatic and EKG shows no evidence of arrhythmia. Acute abdominal series is reassuring. Urinalysis without evidence of infection.  Patient is not currently on any antibiotics which would predispose  him to C. difficile. Patient was given fluids and supportive measures here including Imodium. Discussed with patient's the result. He may have an early viral illness causing him to have diarrhea. The near syncope episode was in the setting of micturition and having a bowel movement. It sounds vasovagal in nature. Discuss with patient follow-up with his dentist and primary physician. Patient stated understanding.  After history, exam, and medical workup I feel the patient has been appropriately medically screened and is safe for discharge home. Pertinent diagnoses were discussed with the patient. Patient was given return precautions.     Shon Baton, MD 10/03/14 [phone]

## 2014-10-03 ENCOUNTER — Emergency Department (HOSPITAL_COMMUNITY): Payer: Self-pay

## 2014-10-03 LAB — URINE MICROSCOPIC-ADD ON

## 2014-10-03 LAB — COMPREHENSIVE METABOLIC PANEL
ALT: 25 U/L (ref 17–63)
ANION GAP: 10 (ref 5–15)
AST: 23 U/L (ref 15–41)
Albumin: 5 g/dL (ref 3.5–5.0)
Alkaline Phosphatase: 70 U/L (ref 38–126)
BUN: 11 mg/dL (ref 6–20)
CO2: 29 mmol/L (ref 22–32)
Calcium: 9.2 mg/dL (ref 8.9–10.3)
Chloride: 97 mmol/L — ABNORMAL LOW (ref 101–111)
Creatinine, Ser: 0.84 mg/dL (ref 0.61–1.24)
Glucose, Bld: 101 mg/dL — ABNORMAL HIGH (ref 65–99)
POTASSIUM: 3.4 mmol/L — AB (ref 3.5–5.1)
SODIUM: 136 mmol/L (ref 135–145)
Total Bilirubin: 0.6 mg/dL (ref 0.3–1.2)
Total Protein: 8.2 g/dL — ABNORMAL HIGH (ref 6.5–8.1)

## 2014-10-03 LAB — CBC WITH DIFFERENTIAL/PLATELET
Basophils Absolute: 0 10*3/uL (ref 0.0–0.1)
Basophils Relative: 0 % (ref 0–1)
EOS PCT: 0 % (ref 0–5)
Eosinophils Absolute: 0.1 10*3/uL (ref 0.0–0.7)
HCT: 47.4 % (ref 39.0–52.0)
Hemoglobin: 16.9 g/dL (ref 13.0–17.0)
LYMPHS ABS: 2.7 10*3/uL (ref 0.7–4.0)
Lymphocytes Relative: 17 % (ref 12–46)
MCH: 33.3 pg (ref 26.0–34.0)
MCHC: 35.7 g/dL (ref 30.0–36.0)
MCV: 93.3 fL (ref 78.0–100.0)
MONO ABS: 1 10*3/uL (ref 0.1–1.0)
Monocytes Relative: 7 % (ref 3–12)
Neutro Abs: 11.7 10*3/uL — ABNORMAL HIGH (ref 1.7–7.7)
Neutrophils Relative %: 76 % (ref 43–77)
PLATELETS: 184 10*3/uL (ref 150–400)
RBC: 5.08 MIL/uL (ref 4.22–5.81)
RDW: 12.3 % (ref 11.5–15.5)
WBC: 15.5 10*3/uL — AB (ref 4.0–10.5)

## 2014-10-03 LAB — URINALYSIS, ROUTINE W REFLEX MICROSCOPIC
Bilirubin Urine: NEGATIVE
Glucose, UA: NEGATIVE mg/dL
Ketones, ur: NEGATIVE mg/dL
Leukocytes, UA: NEGATIVE
NITRITE: NEGATIVE
PROTEIN: NEGATIVE mg/dL
SPECIFIC GRAVITY, URINE: 1.01 (ref 1.005–1.030)
Urobilinogen, UA: 0.2 mg/dL (ref 0.0–1.0)
pH: 6 (ref 5.0–8.0)

## 2014-10-03 LAB — TROPONIN I

## 2014-10-03 MED ORDER — ACETAMINOPHEN 500 MG PO TABS: 500.0000 mg | ORAL_TABLET | Freq: Four times a day (QID) | ORAL | Status: DC | PRN

## 2014-10-03 MED ORDER — CLINDAMYCIN HCL 150 MG PO CAPS: 150.0000 mg | ORAL_CAPSULE | Freq: Four times a day (QID) | ORAL | Status: DC

## 2014-10-03 MED ORDER — LOPERAMIDE HCL 2 MG PO CAPS: 2.0000 mg | ORAL_CAPSULE | Freq: Four times a day (QID) | ORAL | Status: DC | PRN

## 2014-10-03 NOTE — Discharge Instructions (Signed)
You were seen today for multiple complaints. Your workup is reassuring. You may be mildly dehydrated. He has evidence of multiple dental caries in need to follow-up with your dentist. Regarding your diarrhea, this may be related to a viral illness. He need to stay hydrated and take Imodium as needed.  Dental Caries Dental caries (also called tooth decay) is the most common oral disease. It can occur at any age but is more common in children and young adults.  HOW DENTAL CARIES DEVELOPS  The process of decay begins when bacteria and foods (particularly sugars and starches) combine in your mouth to produce plaque. Plaque is a substance that sticks to the hard, outer surface of a tooth (enamel). The bacteria in plaque produce acids that attack enamel. These acids may also attack the root surface of a tooth (cementum) if it is exposed. Repeated attacks dissolve these surfaces and create holes in the tooth (cavities). If left untreated, the acids destroy the other layers of the tooth.  RISK FACTORS  Frequent sipping of sugary beverages.   Frequent snacking on sugary and starchy foods, especially those that easily get stuck in the teeth.   Poor oral hygiene.   Dry mouth.   Substance abuse such as methamphetamine abuse.   Broken or poor-fitting dental restorations.   Eating disorders.   Gastroesophageal reflux disease (GERD).   Certain radiation treatments to the head and neck. SYMPTOMS In the early stages of dental caries, symptoms are seldom present. Sometimes white, chalky areas may be seen on the enamel or other tooth layers. In later stages, symptoms may include:  Pits and holes on the enamel.  Toothache after sweet, hot, or cold foods or drinks are consumed.  Pain around the tooth.  Swelling around the tooth. DIAGNOSIS  Most of the time, dental caries is detected during a regular dental checkup. A diagnosis is made after a thorough medical and dental history is taken and  the surfaces of your teeth are checked for signs of dental caries. Sometimes special instruments, such as lasers, are used to check for dental caries. Dental X-ray exams may be taken so that areas not visible to the eye (such as between the contact areas of the teeth) can be checked for cavities.  TREATMENT  If dental caries is in its early stages, it may be reversed with a fluoride treatment or an application of a remineralizing agent at the dental office. Thorough brushing and flossing at home is needed to aid these treatments. If it is in its later stages, treatment depends on the location and extent of tooth destruction:   If a small area of the tooth has been destroyed, the destroyed area will be removed and cavities will be filled with a material such as gold, silver amalgam, or composite resin.   If a large area of the tooth has been destroyed, the destroyed area will be removed and a cap (crown) will be fitted over the remaining tooth structure.   If the center part of the tooth (pulp) is affected, a procedure called a root canal will be needed before a filling or crown can be placed.   If most of the tooth has been destroyed, the tooth may need to be pulled (extracted). HOME CARE INSTRUCTIONS You can prevent, stop, or reverse dental caries at home by practicing good oral hygiene. Good oral hygiene includes:  Thoroughly cleaning your teeth at least twice a day with a toothbrush and dental floss.   Using a fluoride toothpaste.  A fluoride mouth rinse may also be used if recommended by your dentist or health care provider.   Restricting the amount of sugary and starchy foods and sugary liquids you consume.   Avoiding frequent snacking on these foods and sipping of these liquids.   Keeping regular visits with a dentist for checkups and cleanings. PREVENTION   Practice good oral hygiene.  Consider a dental sealant. A dental sealant is a coating material that is applied by your  dentist to the pits and grooves of teeth. The sealant prevents food from being trapped in them. It may protect the teeth for several years.  Ask about fluoride supplements if you live in a community without fluorinated water or with water that has a low fluoride content. Use fluoride supplements as directed by your dentist or health care provider.  Allow fluoride varnish applications to teeth if directed by your dentist or health care provider. Document Released: 10/12/2001 Document Revised: 06/06/2013 Document Reviewed: 01/23/2012 [facility] Patient Information 2015 [place], [place]. This information is not intended to replace advice given to you by your health care provider. Make sure you discuss any questions you have with your health care provider. Near-Syncope Near-syncope (commonly known as near fainting) is sudden weakness, dizziness, or feeling like you might pass out. During an episode of near-syncope, you may also develop pale skin, have tunnel vision, or feel sick to your stomach (nauseous). Near-syncope may occur when getting up after sitting or while standing for a long time. It is caused by a sudden decrease in blood flow to the brain. This decrease can result from various causes or triggers, most of which are not serious. However, because near-syncope can sometimes be a sign of something serious, a medical evaluation is required. The specific cause is often not determined. HOME CARE INSTRUCTIONS  Monitor your condition for any changes. The following actions may help to alleviate any discomfort you are experiencing:  Have someone stay with you until you feel stable.  Lie down right away and prop your feet up if you start feeling like you might faint. Breathe deeply and steadily. Wait until all the symptoms have passed. Most of these episodes last only a few minutes. You may feel tired for several hours.   Drink enough fluids to keep your urine clear or pale yellow.   If you are taking  blood pressure or heart medicine, get up slowly when seated or lying down. Take several minutes to sit and then stand. This can reduce dizziness.  Follow up with your health care provider as directed. SEEK IMMEDIATE MEDICAL CARE IF:   You have a severe headache.   You have unusual pain in the chest, abdomen, or back.   You are bleeding from the mouth or rectum, or you have black or tarry stool.   You have an irregular or very fast heartbeat.   You have repeated fainting or have seizure-like jerking during an episode.   You faint when sitting or lying down.   You have confusion.   You have difficulty walking.   You have severe weakness.   You have vision problems.  MAKE SURE YOU:   Understand these instructions.  Will watch your condition.  Will get help right away if you are not doing well or get worse. Document Released: 01/20/2005 Document Revised: 01/25/2013 Document Reviewed: 06/25/2012 [facility] Patient Information 2015 [place], [place]. This information is not intended to replace advice given to you by your health care provider. Make sure you discuss any  questions you have with your health care provider. Diarrhea Diarrhea is frequent loose and watery bowel movements. It can cause you to feel weak and dehydrated. Dehydration can cause you to become tired and thirsty, have a dry mouth, and have decreased urination that often is dark yellow. Diarrhea is a sign of another problem, most often an infection that will not last long. In most cases, diarrhea typically lasts 2-3 days. However, it can last longer if it is a sign of something more serious. It is important to treat your diarrhea as directed by your caregiver to lessen or prevent future episodes of diarrhea. CAUSES  Some common causes include:  Gastrointestinal infections caused by viruses, bacteria, or parasites.  Food poisoning or food allergies.  Certain medicines, such as antibiotics, chemotherapy, and  laxatives.  Artificial sweeteners and fructose.  Digestive disorders. HOME CARE INSTRUCTIONS  Ensure adequate fluid intake (hydration): Have 1 cup (8 oz) of fluid for each diarrhea episode. Avoid fluids that contain simple sugars or sports drinks, fruit juices, whole milk products, and sodas. Your urine should be clear or pale yellow if you are drinking enough fluids. Hydrate with an oral rehydration solution that you can purchase at pharmacies, retail stores, and online. You can prepare an oral rehydration solution at home by mixing the following ingredients together:   - tsp table salt.   tsp baking soda.   tsp salt substitute containing potassium chloride.  1  tablespoons sugar.  1 L (34 oz) of water.  Certain foods and beverages may increase the speed at which food moves through the gastrointestinal (GI) tract. These foods and beverages should be avoided and include:  Caffeinated and alcoholic beverages.  High-fiber foods, such as raw fruits and vegetables, nuts, seeds, and whole grain breads and cereals.  Foods and beverages sweetened with sugar alcohols, such as xylitol, sorbitol, and mannitol.  Some foods may be well tolerated and may help thicken stool including:  Starchy foods, such as rice, toast, pasta, low-sugar cereal, oatmeal, grits, baked potatoes, crackers, and bagels.  Bananas.  Applesauce.  Add probiotic-rich foods to help increase healthy bacteria in the GI tract, such as yogurt and fermented milk products.  Wash your hands well after each diarrhea episode.  Only take over-the-counter or prescription medicines as directed by your caregiver.  Take a warm bath to relieve any burning or pain from frequent diarrhea episodes. SEEK IMMEDIATE MEDICAL CARE IF:   You are unable to keep fluids down.  You have persistent vomiting.  You have blood in your stool, or your stools are black and tarry.  You do not urinate in 6-8 hours, or there is only a small  amount of very dark urine.  You have abdominal pain that increases or localizes.  You have weakness, dizziness, confusion, or light-headedness.  You have a severe headache.  Your diarrhea gets worse or does not get better.  You have a fever or persistent symptoms for more than 2-3 days.  You have a fever and your symptoms suddenly get worse. MAKE SURE YOU:   Understand these instructions.  Will watch your condition.  Will get help right away if you are not doing well or get worse. Document Released: 01/10/2002 Document Revised: 06/06/2013 Document Reviewed: 09/28/2011 [facility] Patient Information 2015 [place], [place]. This information is not intended to replace advice given to you by your health care provider. Make sure you discuss any questions you have with your health care provider.

## 2014-12-31 ENCOUNTER — Emergency Department (HOSPITAL_COMMUNITY)
Admission: EM | Admit: 2014-12-31 | Discharge: 2014-12-31 | Disposition: A | Discharge status: Home / Self Care | Payer: Self-pay | Attending: Emergency Medicine | Admitting: Emergency Medicine

## 2014-12-31 ENCOUNTER — Encounter (HOSPITAL_COMMUNITY): Payer: Self-pay | Admitting: *Deleted

## 2014-12-31 DIAGNOSIS — G8929 Other chronic pain: Secondary | ICD-10-CM | POA: Insufficient documentation

## 2014-12-31 DIAGNOSIS — Z87891 Personal history of nicotine dependence: Secondary | ICD-10-CM | POA: Insufficient documentation

## 2014-12-31 DIAGNOSIS — Z91040 Latex allergy status: Secondary | ICD-10-CM | POA: Insufficient documentation

## 2014-12-31 DIAGNOSIS — R51 Headache: Secondary | ICD-10-CM | POA: Insufficient documentation

## 2014-12-31 DIAGNOSIS — Z87438 Personal history of other diseases of male genital organs: Secondary | ICD-10-CM | POA: Insufficient documentation

## 2014-12-31 DIAGNOSIS — Z8639 Personal history of other endocrine, nutritional and metabolic disease: Secondary | ICD-10-CM | POA: Insufficient documentation

## 2014-12-31 DIAGNOSIS — R0989 Other specified symptoms and signs involving the circulatory and respiratory systems: Secondary | ICD-10-CM | POA: Insufficient documentation

## 2014-12-31 DIAGNOSIS — R509 Fever, unspecified: Secondary | ICD-10-CM | POA: Insufficient documentation

## 2014-12-31 DIAGNOSIS — K029 Dental caries, unspecified: Secondary | ICD-10-CM | POA: Insufficient documentation

## 2014-12-31 DIAGNOSIS — Z79899 Other long term (current) drug therapy: Secondary | ICD-10-CM | POA: Insufficient documentation

## 2014-12-31 DIAGNOSIS — Z880 Allergy status to penicillin: Secondary | ICD-10-CM | POA: Insufficient documentation

## 2014-12-31 MED ORDER — ACETAMINOPHEN-CODEINE #3 300-30 MG PO TABS: 1.0000 | ORAL_TABLET | Freq: Four times a day (QID) | ORAL | Status: DC | PRN

## 2014-12-31 MED ORDER — ACETAMINOPHEN-CODEINE #3 300-30 MG PO TABS
2.0000 | ORAL_TABLET | Freq: Once | ORAL | Status: AC
  Administered 2014-12-31: 2 via ORAL
  Filled 2014-12-31: qty 2

## 2014-12-31 NOTE — ED Provider Notes (Signed)
History  By signing my name below, I, Karle PlumberJennifer Tensley, attest that this documentation has been prepared under the direction and in the presence of Ivery QualeHobson Milda Lindvall, PA-C. Electronically Signed: Karle PlumberJennifer Tensley, ED Scribe. 12/31/2014. 4:40 PM.  Chief Complaint  Patient presents with  . Dental Pain   The history is provided by the patient and medical records. No language interpreter was used.    HPI Comments:  Christian Schwartz is a 39 y.o. male who presents to the Emergency Department complaining of severe upper, frontal dental pain that has been ongoing for the past two weeks. He reports associated subjective fever and head pain that radiates up from the mouth. He states he finished a course of Clindamycin about two weeks ago. He states he has not been able to eat secondary to pain. He has not taken anything for pain because he cannot take any OTC pain relief due to gastrointestinal issues. He states he has tried taking Tylenol in the past with no significant relief of the pain. Chewing or drinking anything increases the pain. He denies alleviating factors. He denies current fever, chills, nausea or vomiting. Pt states he is on a waiting list to have his teeth extracted and states he is supposed to follow up again after the first of the year, but does not have a specific date.  Past Medical History  Diagnosis Date  . Chronic back pain     hx of breaking back at age 39 and 5 years ago fell through roof  . Chronic abdominal pain   . Vitamin D deficiency   . Epididymitis, right    Past Surgical History  Procedure Laterality Date  . None     Family History  Problem Relation Age of Onset  . Colon cancer      adopted, unsure   Social History  Substance Use Topics  . Smoking status: Former Smoker -- 0.50 packs/day    Types: Cigarettes    Quit date: 12/31/2014  . Smokeless tobacco: None  . Alcohol Use: No     Comment: former (since 5 years)    Review of Systems  HENT: Positive for  dental problem.   All other systems reviewed and are negative.   Allergies  Amoxicillin; Penicillins; Tramadol; Aspirin; Latex; Naproxen; Nsaids; Sulfa antibiotics; and Zofran  Home Medications   Prior to Admission medications   Medication Sig Start Date End Date Taking? Authorizing Provider  acetaminophen (TYLENOL) 500 MG tablet Take 1 tablet (500 mg total) by mouth every 6 (six) hours as needed. 10/03/14   Shon Batonourtney F Horton, MD  clindamycin (CLEOCIN) 150 MG capsule Take 1 capsule (150 mg total) by mouth every 6 (six) hours. 10/03/14   Shon Batonourtney F Horton, MD  HYDROcodone-acetaminophen (NORCO/VICODIN) 5-325 MG per tablet Take 1 tablet by mouth every 4 (four) hours as needed. Patient not taking: Reported on 06/27/2014 12/26/13   Burgess AmorJulie Idol, PA-C  loperamide (IMODIUM) 2 MG capsule Take 1 capsule (2 mg total) by mouth 4 (four) times daily as needed for diarrhea or loose stools. 10/03/14   Shon Batonourtney F Horton, MD  Misc Natural Products (NF FORMULAS TESTOSTERONE PO) Take 1 tablet by mouth daily. AGELESS MALE-BOOST FREE TESTOSTERONE    Historical Provider, MD  oxyCODONE-acetaminophen (ROXICET) 5-325 MG per tablet Take 1 tablet by mouth every 4 (four) hours as needed for severe pain. Patient not taking: Reported on 12/26/2013 10/02/13   [facility] Orlene OchM Neese, NP  penicillin v potassium (VEETID) 500 MG tablet Take 1 tablet (500 mg  total) by mouth 4 (four) times daily. Patient not taking: Reported on 12/26/2013 09/22/13   Vanetta Mulders, MD   Triage Vitals: BP 102/80 mmHg  Pulse 69  Temp(Src) 97.4 F (36.3 C) (Oral)  Resp 16  Ht  (1.778 m)  Wt 173 lb 6.4 oz (78.654 kg)  BMI 24.88 kg/m2  SpO2 100% Physical Exam  Constitutional: He is oriented to person, place, and time. He appears well-developed and well-nourished.  HENT:  Head: Normocephalic and atraumatic.  Mouth/Throat: Abnormal dentition. Dental caries present. No dental abscesses.  No loss of nasolabial fold. No facial asymmetry. No temperature  changes of the face. No swelling under the tongue. Upper front teeth from right canine to left canine with severe dental decay.  Eyes: EOM are normal.  Neck: Normal range of motion.  Cardiovascular: Normal rate, regular rhythm and normal heart sounds.  Exam reveals no gallop and no friction rub.   No murmur heard. Pulmonary/Chest: Effort normal. He has rhonchi.  Symmetrical rise and fall of the chest. Bilateral rhonchi present.  Musculoskeletal: Normal range of motion.  Full ROM of neck.  Neurological: He is alert and oriented to person, place, and time.  Skin: Skin is warm and dry. No rash noted.  No rash to palms.  Psychiatric: He has a normal mood and affect. His behavior is normal.  Nursing note and vitals reviewed.   ED Course  Procedures (including critical care time) DIAGNOSTIC STUDIES: Oxygen Saturation is 100% on RA, normal by my interpretation.   COORDINATION OF CARE: 4:32 PM- Advised pt to use OTC Anbesol before attempting to eat and make sure to follow up with dental clinic as previously directed. Will prescribe Tylenol with Codeine. Pt verbalizes understanding and agrees to plan.  Medications  acetaminophen-codeine (TYLENOL #3) 300-30 MG per tablet 2 tablet (not administered)    MDM  Vital signs are well within normal limits. Pulse oximetry is 100% on room air. No visible abscess appreciated. No evidence for Ludwig's Angina. patient is scheduled to see a dentist by the beginning of the year. Patient is allergic to multiple medications including NSAIDs. Prescription for Tylenol codeine, given to the patient.    Final diagnoses:  None    *I have reviewed nursing notes, vital signs, and all appropriate lab and imaging results for this patient. I personally performed the services described in this documentation, which was scribed in my presence. The recorded information has been reviewed and is accurate.    Ivery Quale, PA-C 12/31/14 1646  Samuel Jester,  DO 01/02/15 2006

## 2014-12-31 NOTE — ED Notes (Signed)
Pt has been seen by the pain clinic and given clindamycin (finished dose). Pt states his infection feels better but he is having persistent dental pain. NAD noted, no airway swelling

## 2014-12-31 NOTE — Discharge Instructions (Signed)
Dental Caries °Dental caries is tooth decay. This decay can cause a hole in teeth (cavity) that can get bigger and deeper over time. °HOME CARE °· Brush and floss your teeth. Do this at least two times a day. °· Use a fluoride toothpaste. °· Use a mouth rinse if told by your dentist or doctor. °· Eat less sugary and starchy foods. Drink less sugary drinks. °· Avoid snacking often on sugary and starchy foods. Avoid sipping often on sugary drinks. °· Keep regular checkups and cleanings with your dentist. °· Use fluoride supplements if told by your dentist or doctor. °· Allow fluoride to be applied to teeth if told by your dentist or doctor. °  °This information is not intended to replace advice given to you by your health care provider. Make sure you discuss any questions you have with your health care provider. °  °Document Released: 10/30/2007 Document Revised: 02/10/2014 Document Reviewed: 01/23/2012 °Elsevier Interactive Patient Education ©2016 [employer] Inc[employer] ° °

## 2015-01-31 ENCOUNTER — Emergency Department (HOSPITAL_COMMUNITY)
Admission: EM | Admit: 2015-01-31 | Discharge: 2015-01-31 | Disposition: A | Discharge status: Home / Self Care | Payer: Self-pay | Attending: Emergency Medicine | Admitting: Emergency Medicine

## 2015-01-31 DIAGNOSIS — Z87438 Personal history of other diseases of male genital organs: Secondary | ICD-10-CM | POA: Insufficient documentation

## 2015-01-31 DIAGNOSIS — K089 Disorder of teeth and supporting structures, unspecified: Secondary | ICD-10-CM | POA: Insufficient documentation

## 2015-01-31 DIAGNOSIS — Z880 Allergy status to penicillin: Secondary | ICD-10-CM | POA: Insufficient documentation

## 2015-01-31 DIAGNOSIS — J0100 Acute maxillary sinusitis, unspecified: Secondary | ICD-10-CM | POA: Insufficient documentation

## 2015-01-31 DIAGNOSIS — K0530 Chronic periodontitis, unspecified: Secondary | ICD-10-CM | POA: Insufficient documentation

## 2015-01-31 DIAGNOSIS — Z8639 Personal history of other endocrine, nutritional and metabolic disease: Secondary | ICD-10-CM | POA: Insufficient documentation

## 2015-01-31 DIAGNOSIS — G8929 Other chronic pain: Secondary | ICD-10-CM | POA: Insufficient documentation

## 2015-01-31 DIAGNOSIS — R61 Generalized hyperhidrosis: Secondary | ICD-10-CM | POA: Insufficient documentation

## 2015-01-31 DIAGNOSIS — R110 Nausea: Secondary | ICD-10-CM | POA: Insufficient documentation

## 2015-01-31 DIAGNOSIS — R51 Headache: Secondary | ICD-10-CM | POA: Insufficient documentation

## 2015-01-31 DIAGNOSIS — Z91040 Latex allergy status: Secondary | ICD-10-CM | POA: Insufficient documentation

## 2015-01-31 DIAGNOSIS — Z79899 Other long term (current) drug therapy: Secondary | ICD-10-CM | POA: Insufficient documentation

## 2015-01-31 DIAGNOSIS — K08409 Partial loss of teeth, unspecified cause, unspecified class: Secondary | ICD-10-CM | POA: Insufficient documentation

## 2015-01-31 DIAGNOSIS — R590 Localized enlarged lymph nodes: Secondary | ICD-10-CM | POA: Insufficient documentation

## 2015-01-31 DIAGNOSIS — Z87891 Personal history of nicotine dependence: Secondary | ICD-10-CM | POA: Insufficient documentation

## 2015-01-31 DIAGNOSIS — K029 Dental caries, unspecified: Secondary | ICD-10-CM | POA: Insufficient documentation

## 2015-01-31 DIAGNOSIS — K05219 Aggressive periodontitis, localized, unspecified severity: Secondary | ICD-10-CM | POA: Insufficient documentation

## 2015-01-31 DIAGNOSIS — R109 Unspecified abdominal pain: Secondary | ICD-10-CM | POA: Insufficient documentation

## 2015-01-31 MED ORDER — CLINDAMYCIN HCL 150 MG PO CAPS: 150.0000 mg | ORAL_CAPSULE | Freq: Four times a day (QID) | ORAL | Status: DC

## 2015-01-31 MED ORDER — OXYCODONE-ACETAMINOPHEN 5-325 MG PO TABS
1.0000 | ORAL_TABLET | Freq: Once | ORAL | Status: AC
  Administered 2015-01-31: 1 via ORAL
  Filled 2015-01-31: qty 1

## 2015-01-31 MED ORDER — ACETAMINOPHEN ER 650 MG PO TBCR: 650.0000 mg | EXTENDED_RELEASE_TABLET | Freq: Three times a day (TID) | ORAL | Status: DC | PRN

## 2015-01-31 NOTE — ED Provider Notes (Signed)
CSN: [id number]   Arrival date & time 01/31/15 1921  History  By signing my name below, I, Bethel BornBritney McCollum, attest that this documentation has been prepared under the direction and in the presence of Derwood KaplanAnkit Jovann Luse, MD. Electronically Signed: Bethel BornBritney McCollum, ED Scribe. 01/31/2015. 8:54 PM.  Chief Complaint  Patient presents with  . Dental Pain  . Facial Pain    HPI The history is provided by the patient. No language interpreter was used.   Christian Schwartz is a 39 y.o. male with history of chronic pain who presents to the Emergency Department complaining of constant, 10/10 in severity,  diffuse dental pain with onset weeks ago. The pain is worse with eating and the pt states that he has not eaten well over the last 4 days. He has sensitivity to cold and heat. The pain radiates to the ear and down the right side of the neck. He has been seen for the pain in the past and was prescribed Tylenol 3 but is out. Associated symptoms include sweating, chills, a foul taste in the mouth (but no noted drainage).  He also complains of sinus congestion , headache, post-nasal drip, nausea, and abdominal pain. He states that he is on a waiting list to be seen at a dental clinic. Pt denies measured fever and difficulty swallowing. He states that he is unable to take NSAIDs because his blood is thin and his gastroenterologist has advised against it.   Past Medical History  Diagnosis Date  . Chronic back pain     hx of breaking back at age 39 and 5 years ago fell through roof  . Chronic abdominal pain   . Vitamin D deficiency   . Epididymitis, right     Past Surgical History  Procedure Laterality Date  . None      Family History  Problem Relation Age of Onset  . Colon cancer      adopted, unsure    Social History  Substance Use Topics  . Smoking status: Former Smoker -- 0.50 packs/day    Types: Cigarettes    Quit date: 12/31/2014  . Smokeless tobacco: Not on file  . Alcohol Use: No     Comment:  former (since 5 years)     Review of Systems  Constitutional: Positive for chills and diaphoresis. Negative for fever.  HENT: Positive for congestion, dental problem, postnasal drip and sinus pressure. Negative for trouble swallowing.        Foul taste in the mouth  Gastrointestinal: Positive for nausea and abdominal pain.  Neurological: Positive for headaches.   10 Systems reviewed and all are negative for acute change except as noted in the HPI.  Home Medications   Prior to Admission medications   Medication Sig Start Date End Date Taking? Authorizing Provider  acetaminophen (TYLENOL 8 HOUR) 650 MG CR tablet Take 1 tablet (650 mg total) by mouth every 8 (eight) hours as needed for pain. 01/31/15   Derwood KaplanAnkit Romie Keeble, MD  acetaminophen-codeine (TYLENOL #3) 300-30 MG tablet Take 1-2 tablets by mouth every 6 (six) hours as needed for moderate pain. 12/31/14   Ivery QualeHobson Bryant, PA-C  clindamycin (CLEOCIN) 150 MG capsule Take 1 capsule (150 mg total) by mouth every 6 (six) hours. 01/31/15   Derwood KaplanAnkit Valia Wingard, MD  HYDROcodone-acetaminophen (NORCO/VICODIN) 5-325 MG per tablet Take 1 tablet by mouth every 4 (four) hours as needed. Patient not taking: Reported on 06/27/2014 12/26/13   Burgess AmorJulie Idol, PA-C  loperamide (IMODIUM) 2 MG capsule Take 1  capsule (2 mg total) by mouth 4 (four) times daily as needed for diarrhea or loose stools. 10/03/14   Shon Baton, MD  Misc Natural Products (NF FORMULAS TESTOSTERONE PO) Take 1 tablet by mouth daily. AGELESS MALE-BOOST FREE TESTOSTERONE    Historical Provider, MD  oxyCODONE-acetaminophen (ROXICET) 5-325 MG per tablet Take 1 tablet by mouth every 4 (four) hours as needed for severe pain. Patient not taking: Reported on 12/26/2013 10/02/13   Janne Napoleon, NP    Allergies  Amoxicillin; Penicillins; Tramadol; Aspirin; Latex; Naproxen; Nsaids; Sulfa antibiotics; and Zofran  Triage Vitals: BP 117/74 mmHg  Pulse 81  Temp(Src) 97.8 F (36.6 C) (Oral)  Resp 20  Ht   (1.778 m)  Wt 168 lb 11.2 oz (76.522 kg)  BMI 24.21 kg/m2  SpO2 98%  Physical Exam  Constitutional: He is oriented to person, place, and time. He appears well-developed and well-nourished.  HENT:  Head: Normocephalic.  Nose: Right sinus exhibits maxillary sinus tenderness. Left sinus exhibits maxillary sinus tenderness.  Mouth/Throat: No trismus in the jaw. Abnormal dentition. Dental caries present.  Pt has cavities over multiple teeth and decay present in multiple teeth. Pt is missing 4-6 teeth. No trismus. TTP of the teeth with the tongue depressor. No gingival fluctuance but pt has TTP and there appears to be slight edema over the right lower quadrant.   Eyes: EOM are normal.  Neck: Normal range of motion.  Cardiovascular:  No murmur heard. Pulmonary/Chest: Effort normal.  CTAB  Abdominal: He exhibits no distension.  Musculoskeletal: Normal range of motion.  Lymphadenopathy:       Head (right side): No preauricular and no posterior auricular adenopathy present.       Head (left side): No preauricular and no posterior auricular adenopathy present.    He has cervical adenopathy (anterior).  Neurological: He is alert and oriented to person, place, and time.  Psychiatric: He has a normal mood and affect.  Nursing note and vitals reviewed.   ED Course  Procedures   DIAGNOSTIC STUDIES: Oxygen Saturation is 98% on RA, normal by my interpretation.    COORDINATION OF CARE: 8:31 PM Discussed treatment plan which includes abx and pain management with pt at bedside and pt agreed to plan.  Labs Reviewed - No data to display  Imaging Review No results found.    MDM   Final diagnoses:  Periodontal abscess  Periodontitis  Acute maxillary sinusitis, recurrence not specified    I personally performed the services described in this documentation, which was scribed in my presence. The recorded information has been reviewed and is accurate.  Pt comes in with toothache and  sinus congestion. 2 separate process per hx and exam.  DDx includes: - Periapical tooth infection - Dental abscess - Gingivitis - Dental trauma - Pulpitis - Nerve root compression  There is no trismus, confusion, fevers (here), pt is not toxic appearing, no seizures, no focal neuro complains - unlikely for this infection to have spread to the brain or mediastinum. He is waiting for free dental clinic. We will give antibiotics again....  Has sinusitis/congestion. Advised OTC meds for symptoms relief.   Derwood Kaplan, MD 01/31/15 2110

## 2015-01-31 NOTE — Discharge Instructions (Signed)
We saw you in the ER for the toothache and sinusitis. No evidence of deep infection, no evidence of pus that can be drained out. YOU WILL NEED TO SEE A DENTIST to get appropriate care. Please take the antibiotics and pain meds provided in the interval.    Gingivitis Gingivitis is a form of gum (periodontal) disease that causes redness, soreness, and swelling (inflammation) of your gums. CAUSES The most common cause of gingivitis is poor oral hygiene. A sticky substance made of bacteria, mucus, and food particles (plaque), is deposited on the exposed part of teeth. As plaque builds up, it reacts with the saliva in your mouth to form something called  tartar. Tartar is a hard deposit that becomes trapped around the base of the tooth. Plaque and tartar irritate the gums, leading to the formation of gingivitis. Other factors that increase your risk for gingivitis include:   Tobacco use.  Diabetes.  Older age.  Certain medications.  Certain viral or fungal infections.  Dry mouth.  Hormonal changes such as during pregnancy.  Poor nutrition.  Substance abuse.  Poor fitting dental restorations or appliances. SYMPTOMS You may notice inflammation of the soft tissue (gingiva) around the teeth. When these tissues become inflamed, they bleed easily, especially during flossing or brushing. The gums may also be:   Tender to the touch.  Bright red, purple red, or have a shiny appearance.  Swollen.  Wearing away from the teeth (receding), which exposes more of the tooth. Bad breath is often present. Continued infection around teeth can eventually cause cavities and loosen teeth. This may lead to eventual tooth loss. DIAGNOSIS A medical and dental history will be taken. Your mouth, teeth, and gums will be examined. Your dentist will look for soft, swollen purple-red, irritated gums. There may be deposits of plaque and tartar at the base of the teeth. Your gums will be looked at for the  degree of redness, puffiness, and bleeding tendencies. Your dentist will see if any of the teeth are loose. X-rays may be taken to see if the inflammation has spread to the supporting structures of the teeth. TREATMENT The goal is to reduce and reverse the inflammation. Proper treatment can usually reverse the symptoms of gingivitis and prevent further progression of the disease. Have your teeth cleaned. During the cleaning, all plaque and tartar will be removed. Instruction for proper home care will be given. You will need regular professional cleanings and check-ups in the future. HOME CARE INSTRUCTIONS  Brush your teeth twice a day and floss at least once per day. When flossing, it is best to floss first then brush.  Limit sugar between meals and maintain a well-balanced diet.  Even the best dental hygiene will not prevent plaque from developing. It is necessary for you to see your dentist on a regular basis for cleaning and regular checkups.  Your dentist can recommend proper oral hygiene and mouth care and suggest special toothpastes or mouth rinses.  Stop smoking. SEEK DENTAL OR MEDICAL CARE IF:  You have painful, reddened tissue around your teeth, or you have puffy swollen gums.  You have difficulty chewing.  You notice any loose or infected teeth.  You have swollen glands.  Your gums bleed easily when you brush your teeth or are very tender to the touch.   This information is not intended to replace advice given to you by your health care provider. Make sure you discuss any questions you have with your health care provider.  Document Released: 07/16/2000 Document Revised: 04/14/2011 Document Reviewed: 09/04/2014 Elsevier Interactive Patient Education 2016 Else[employer] Inc.  Periodontal Disease Periodontal disease, or gum disease, is a type of oral disease that affects the surrounding and supporting tissues of the teeth. These include the gums (gingivae), ligaments, and tooth  socket (alveolar bone). Periodontal disease can affect one tooth or many teeth. If left untreated, it may lead to tooth loss.  CAUSES The main cause of periodontal disease is dental plaque, which contains harmful bacteria. These bacteria can cause the gums to become inflamed and infected. Further progression of the disease can damage the other supporting tissues.  RISK FACTORS  Diabetes.   Smoking and tobacco use.   Genetics.   Hormonal changes of puberty, menopause, and pregnancy.   Stress.   Clenching or grinding your teeth.   Substance abuse.  Poor nutrition.   Diseases that interfere with the body's immune system.   Certain medicines. SIGNS AND SYMPTOMS  Red or swollen gums.  Bad breath that does not go away.  Gums that have pulled away from the teeth.  Gums that bleed easily.  Permanent teeth that are loose or separating.  Pain when chewing.  Changes in the way your teeth fit together.  Sensitive teeth. DIAGNOSIS  A thorough examination of the periodontal tissues will be done by your dentist. X-rays may be needed. Evaluation of your medical history will be needed to see if there are other factors or underlying conditions that may contribute to the disease. TREATMENT The number and types of treatment will vary depending on the extent of the disease. Treatment may include brushing and flossing only. Further disease progression may necessitate scaling and root planing or even surgery. The main goal is to control the infection. Good oral hygiene at home is necessary for the success of all types of treatment. HOME CARE INSTRUCTIONS   Practice good oral hygiene. This includes flossing and brushing your teeth every day.   See your dentist regularly, at least 2 times per year.   Stop smoking if you smoke.  Eat a well-balanced diet. SEEK IMMEDIATE DENTAL CARE IF:   You have any signs or symptoms of periodontal disease along with:  Swelling of your face,  neck, or jaw.  Inability to open your mouth.  Severe pain uncontrolled by pain medicine.  You have a fever or persistent symptoms for more than 2-3 days.  You have a fever and your symptoms suddenly get worse.   This information is not intended to replace advice given to you by your health care provider. Make sure you discuss any questions you have with your health care provider.   Document Released: 01/23/2003 Document Revised: 09/22/2012 Document Reviewed: 06/29/2012 Elsevier Interactive Patient Education 2016 [employer] Inc.  Sinusitis, Adult Sinusitis is redness, soreness, and puffiness (inflammation) of the air pockets in the bones of your face (sinuses). The redness, soreness, and puffiness can cause air and mucus to get trapped in your sinuses. This can allow germs to grow and cause an infection.  HOME CARE   Drink enough fluids to keep your pee (urine) clear or pale yellow.  Use a humidifier in your home.  Run a hot shower to create steam in the bathroom. Sit in the bathroom with the door closed. Breathe in the steam 3-4 times a day.  Put a warm, moist washcloth on your face 3-4 times a day, or as told by your doctor.  Use salt water sprays (saline sprays) to wet the thick fluid  in your nose. This can help the sinuses drain.  Only take medicine as told by your doctor. GET HELP RIGHT AWAY IF:   Your pain gets worse.  You have very bad headaches.  You are sick to your stomach (nauseous).  You throw up (vomit).  You are very sleepy (drowsy) all the time.  Your face is puffy (swollen).  Your vision changes.  You have a stiff neck.  You have trouble breathing. MAKE SURE YOU:   Understand these instructions.  Will watch your condition.  Will get help right away if you are not doing well or get worse.   This information is not intended to replace advice given to you by your health care provider. Make sure you discuss any questions you have with your health  care provider.   Document Released: 07/09/2007 Document Revised: 02/10/2014 Document Reviewed: 08/26/2011 Elsevier Interactive Patient Education [employer]

## 2015-01-31 NOTE — ED Notes (Signed)
Pt co dental pain , with  Sinus pressure /congestion and ear problems. Stated that he has noticed a rash on his lt hand and is concerned that he might have an infection from his teeth .

## 2015-06-27 ENCOUNTER — Emergency Department (HOSPITAL_COMMUNITY)
Admission: EM | Admit: 2015-06-27 | Discharge: 2015-06-28 | Disposition: A | Discharge status: Home / Self Care | Payer: Self-pay | Attending: Emergency Medicine | Admitting: Emergency Medicine

## 2015-06-27 ENCOUNTER — Encounter (HOSPITAL_COMMUNITY): Payer: Self-pay | Admitting: Emergency Medicine

## 2015-06-27 DIAGNOSIS — R42 Dizziness and giddiness: Secondary | ICD-10-CM

## 2015-06-27 DIAGNOSIS — M546 Pain in thoracic spine: Secondary | ICD-10-CM

## 2015-06-27 DIAGNOSIS — F17210 Nicotine dependence, cigarettes, uncomplicated: Secondary | ICD-10-CM | POA: Insufficient documentation

## 2015-06-27 DIAGNOSIS — R519 Headache, unspecified: Secondary | ICD-10-CM

## 2015-06-27 DIAGNOSIS — R110 Nausea: Secondary | ICD-10-CM | POA: Insufficient documentation

## 2015-06-27 DIAGNOSIS — M542 Cervicalgia: Secondary | ICD-10-CM

## 2015-06-27 DIAGNOSIS — R51 Headache: Secondary | ICD-10-CM | POA: Insufficient documentation

## 2015-06-27 NOTE — ED Notes (Signed)
Pt states he was laying down and started having neck pain that is now shooting down into his pain.  Pt states he feels dizzy and like he is going to pass out.

## 2015-06-28 ENCOUNTER — Emergency Department (HOSPITAL_COMMUNITY): Payer: Self-pay

## 2015-06-28 ENCOUNTER — Encounter (HOSPITAL_COMMUNITY): Payer: Self-pay | Admitting: Emergency Medicine

## 2015-06-28 LAB — CBC WITH DIFFERENTIAL/PLATELET
BASOS ABS: 0 10*3/uL (ref 0.0–0.1)
BASOS PCT: 0 %
Eosinophils Absolute: 0.1 10*3/uL (ref 0.0–0.7)
Eosinophils Relative: 2 %
HEMATOCRIT: 43 % (ref 39.0–52.0)
HEMOGLOBIN: 14.7 g/dL (ref 13.0–17.0)
LYMPHS ABS: 3.3 10*3/uL (ref 0.7–4.0)
LYMPHS PCT: 42 %
MCH: 32 pg (ref 26.0–34.0)
MCHC: 34.2 g/dL (ref 30.0–36.0)
MCV: 93.7 fL (ref 78.0–100.0)
Monocytes Absolute: 0.6 10*3/uL (ref 0.1–1.0)
Monocytes Relative: 8 %
NEUTROS PCT: 48 %
Neutro Abs: 3.8 10*3/uL (ref 1.7–7.7)
Platelets: 213 10*3/uL (ref 150–400)
RBC: 4.59 MIL/uL (ref 4.22–5.81)
RDW: 13 % (ref 11.5–15.5)
WBC: 7.8 10*3/uL (ref 4.0–10.5)

## 2015-06-28 LAB — BASIC METABOLIC PANEL
ANION GAP: 6 (ref 5–15)
BUN: 5 mg/dL — ABNORMAL LOW (ref 6–20)
CHLORIDE: 102 mmol/L (ref 101–111)
CO2: 29 mmol/L (ref 22–32)
Calcium: 9 mg/dL (ref 8.9–10.3)
Creatinine, Ser: 0.61 mg/dL (ref 0.61–1.24)
GFR calc non Af Amer: >60 mL/min (ref >60)
Glucose, Bld: 85 mg/dL (ref 65–99)
Potassium: 3.6 mmol/L (ref 3.5–5.1)
Sodium: 137 mmol/L (ref 135–145)

## 2015-06-28 LAB — I-STAT TROPONIN, ED: Troponin i, poc: 0 ng/mL (ref 0.00–0.08)

## 2015-06-28 MED ORDER — IOPAMIDOL (ISOVUE-370) INJECTION 76%
100.0000 mL | Freq: Once | INTRAVENOUS | Status: AC | PRN
  Administered 2015-06-28: 100 mL via INTRAVENOUS

## 2015-06-28 MED ORDER — CYCLOBENZAPRINE HCL 10 MG PO TABS
10.0000 mg | ORAL_TABLET | Freq: Once | ORAL | Status: AC
  Administered 2015-06-28: 10 mg via ORAL
  Filled 2015-06-28: qty 1

## 2015-06-28 MED ORDER — ACETAMINOPHEN-CODEINE #3 300-30 MG PO TABS
1.0000 | ORAL_TABLET | Freq: Once | ORAL | Status: AC
  Administered 2015-06-28: 1 via ORAL
  Filled 2015-06-28: qty 1

## 2015-06-28 NOTE — ED Provider Notes (Signed)
CSN: [id number]     Arrival date & time 06/27/15  2344 History  By signing my name below, I, Christian Schwartz and Christian Schwartz, attest that this documentation has been prepared under the direction and in the presence of Lavera Guise, MD. Electronically Signed: Octavia Heir, ED Scribe. 06/28/2015. 12:33 AM.    Chief Complaint  Patient presents with  . Neck Pain  . Dizziness      The history is provided by the patient. No language interpreter was used.   HPI Comments: LENNARD CAPEK is a 40 y.o. male who presents to the Emergency Department with PMHx of chronic back pain complaining of sudden onset, sharp, tearing and shooting neck pain that radiates between shoulder blades and up into the back of his head, that began less than 1 hour ago. Pt reports associated symptoms of dizziness, light-headedness, loss of appetite, and nausea. Pt reports he was laying in bed when the pain started. Pt has PMHx of back pain. States he has never had neck pain like this before.  He has not taken any medication to alleviate his pain. Pt denies any fall, heavy lifting, strenuous activity, vomiting, cough, or diarrhea. No vision or speech changes, numbness or weakness, syncope.   Past Medical History  Diagnosis Date  . Chronic back pain     hx of breaking back at age 31 and 5 years ago fell through roof  . Chronic abdominal pain   . Vitamin D deficiency   . Epididymitis, right    Past Surgical History  Procedure Laterality Date  . None     Family History  Problem Relation Age of Onset  . Colon cancer      adopted, unsure   Social History  Substance Use Topics  . Smoking status: Current Some Day Smoker -- 0.50 packs/day    Types: Cigarettes    Last Attempt to Quit: 12/31/2014  . Smokeless tobacco: None  . Alcohol Use: No     Comment: former (since 5 years)    Review of Systems  Constitutional: Positive for appetite change.  Respiratory: Negative for cough.   Gastrointestinal: Positive for  nausea.  Musculoskeletal: Positive for neck pain.  Neurological: Positive for dizziness and light-headedness.  All other systems reviewed and are negative.     Allergies  Amoxicillin; Penicillins; Tramadol; Aspirin; Latex; Naproxen; Nsaids; Sulfa antibiotics; and Zofran  Home Medications   Prior to Admission medications   Medication Sig Start Date End Date Taking? Authorizing Provider  acetaminophen (TYLENOL 8 HOUR) 650 MG CR tablet Take 1 tablet (650 mg total) by mouth every 8 (eight) hours as needed for pain. 01/31/15   Derwood Kaplan, MD  acetaminophen-codeine (TYLENOL #3) 300-30 MG tablet Take 1-2 tablets by mouth every 6 (six) hours as needed for moderate pain. 12/31/14   Ivery Quale, PA-C  clindamycin (CLEOCIN) 150 MG capsule Take 1 capsule (150 mg total) by mouth every 6 (six) hours. 01/31/15   Derwood Kaplan, MD  HYDROcodone-acetaminophen (NORCO/VICODIN) 5-325 MG per tablet Take 1 tablet by mouth every 4 (four) hours as needed. Patient not taking: Reported on 06/27/2014 12/26/13   [name], PA-C  loperamide (IMODIUM) 2 MG capsule Take 1 capsule (2 mg total) by mouth 4 (four) times daily as needed for diarrhea or loose stools. 10/03/14   Shon Baton, MD  Misc Natural Products (NF FORMULAS TESTOSTERONE PO) Take 1 tablet by mouth daily. AGELESS MALE-BOOST FREE TESTOSTERONE    Historical Provider, MD  oxyCODONE-acetaminophen (ROXICET) 5-325  MG per tablet Take 1 tablet by mouth every 4 (four) hours as needed for severe pain. Patient not taking: Reported on 12/26/2013 10/02/13   Janne Napoleon, NP   Triage vitals: BP 110/56 mmHg  Pulse 65  Temp(Src) 98.8 F (37.1 C) (Oral)  Resp 18  Ht 5\' 10"  (1.778 m)  Wt 160 lb (72.576 kg)  BMI 22.96 kg/m2  SpO2 99% Physical Exam Physical Exam  Nursing note and vitals reviewed. Constitutional: Well developed, well nourished, non-toxic, and in no acute distress Head: Normocephalic and atraumatic.  Mouth/Throat: Oropharynx is clear and  moist.  Neck: Normal range of motion. Neck supple. Tender along c-spine without deformities or step offs. Tenderness in bilateral parapsinal neck muscles Cardiovascular: Normal rate and regular rhythm.  Tender to palpation interscapularly over upper thoracic spine Pulmonary/Chest: Effort normal and breath sounds normal.  Abdominal: Soft. There is no tenderness. There is no rebound and no guarding.  Musculoskeletal: Normal range of motion.  Neurological:  Alert, oriented to person, place, time, and situation. Memory grossly in tact. Fluent speech. No dysarthria or aphasia.  Cranial nerves: Pupils are symmetric, and reactive to light. EOMI without nystagmus. No gaze deviation. Facial muscles symmetric with activation. Sensation to light touch over face in tact bilaterally. Hearing grossly in tact. Palate elevates symmetrically. Head turn and shoulder shrug are intact. Tongue midline.  Reflexes defered.  Muscle bulk and tone normal. No pronator drift. Moves all extremities symmetrically. Sensation to light touch is in tact throughout in bilateral upper and lower extremities. Coordination reveals no dysmetria with finger to nose. Skin: Skin is warm and dry.  Psychiatric: Cooperative    ED Course  Procedures  DIAGNOSTIC STUDIES: Oxygen Saturation is 99% on RA, normal by my interpretation.  COORDINATION OF CARE: 12:19 AM Discussed treatment plan which includes imaging and blood work with pt at bedside and pt agreed to plan.  Labs Review Labs Reviewed  BASIC METABOLIC PANEL - Abnormal; Notable for the following:    BUN 5 (*)    All other components within normal limits  CBC WITH DIFFERENTIAL/PLATELET  I-STAT TROPOININ, ED    Imaging Review Ct Angio Head W/cm &/or Wo Cm  06/28/2015  CLINICAL DATA:  Acute onset sharp shooting neck pain radiating to back of head for 1 hour associated with dizziness, lightheadedness, nausea. History of chronic back pain. EXAM: CT ANGIOGRAPHY HEAD AND NECK  TECHNIQUE: Multidetector CT imaging of the head and neck was performed using the standard protocol during bolus administration of intravenous contrast. Multiplanar CT image reconstructions and MIPs were obtained to evaluate the vascular anatomy. Carotid stenosis measurements (when applicable) are obtained utilizing NASCET criteria, using the distal internal carotid diameter as the denominator. CONTRAST:  100 cc Isovue 370 COMPARISON:  CT cervical spine December 01, 2011 FINDINGS: CT HEAD INTRACRANIAL CONTENTS: The ventricles and sulci are normal. No intraparenchymal hemorrhage, mass effect nor midline shift. No acute large vascular territory infarcts. No abnormal extra-axial fluid collections. Basal cisterns are patent. No abnormal intracranial enhancement. ORBITS: The included ocular globes and orbital contents are normal. Old LEFT orbital floor fracture. SINUSES: Chronic severe LEFT maxillary sinusitis. SKULL/SOFT TISSUES: No skull fracture. No significant soft tissue swelling. CTA NECK Aortic arch: Normal appearance of the thoracic arch, normal branch pattern. The origins of the innominate, left Common carotid artery and subclavian artery are widely patent. Shoulder attenuation results in streak artifact through the lower neck. Right carotid system: Common carotid artery is widely patent, coursing in a straight line fashion.  Normal appearance of the carotid bifurcation without hemodynamically significant stenosis by NASCET criteria. Normal appearance of the included internal carotid artery. Left carotid system: Common carotid artery is widely patent, coursing in a straight line fashion. Normal appearance of the carotid bifurcation without hemodynamically significant stenosis by NASCET criteria. Normal appearance of the included internal carotid artery. Vertebral arteries:Left vertebral artery is dominant. Normal appearance of the vertebral arteries, which appear widely patent. Skeleton: No acute osseous process  though bone windows have not been submitted. Stable mild degenerative change of the cervical spine. Other neck: Soft tissues of the neck are non-acute though, not tailored for evaluation. Nuchal ligament calcifications. CTA HEAD Anterior circulation: Normal appearance of the cervical internal carotid arteries, petrous, cavernous and supra clinoid internal carotid arteries. Widely patent anterior communicating artery. Normal appearance of the anterior and middle cerebral arteries. Posterior circulation: Normal appearance of the vertebral arteries, vertebrobasilar junction and basilar artery, as well as main branch vessels. Normal appearance of the posterior cerebral arteries. No large vessel occlusion, hemodynamically significant stenosis, dissection, luminal irregularity, contrast extravasation or aneurysm within the anterior nor posterior circulation. IMPRESSION: Normal CT HEAD with and without contrast. Normal CTA HEAD and neck. Electronically Signed   By: Awilda Metro M.D.   On: 06/28/2015 02:10   Dg Chest 2 View  06/28/2015  CLINICAL DATA:  Acute onset of neck pain, radiating between the shoulder blades. Dizziness and lightheadedness. Loss of appetite and nausea. Initial encounter. EXAM: CHEST  2 VIEW COMPARISON:  Chest radiograph performed 10/03/2014 FINDINGS: The lungs are well-aerated and clear. There is no evidence of focal opacification, pleural effusion or pneumothorax. The heart is normal in size; the mediastinal contour is within normal limits. No acute osseous abnormalities are seen. IMPRESSION: No acute cardiopulmonary process seen. Electronically Signed   By: Roanna Raider M.D.   On: 06/28/2015 01:52   Ct Angio Neck W/cm &/or Wo/cm  06/28/2015  CLINICAL DATA:  Acute onset sharp shooting neck pain radiating to back of head for 1 hour associated with dizziness, lightheadedness, nausea. History of chronic back pain. EXAM: CT ANGIOGRAPHY HEAD AND NECK TECHNIQUE: Multidetector CT imaging of the  head and neck was performed using the standard protocol during bolus administration of intravenous contrast. Multiplanar CT image reconstructions and MIPs were obtained to evaluate the vascular anatomy. Carotid stenosis measurements (when applicable) are obtained utilizing NASCET criteria, using the distal internal carotid diameter as the denominator. CONTRAST:  100 cc Isovue 370 COMPARISON:  CT cervical spine December 01, 2011 FINDINGS: CT HEAD INTRACRANIAL CONTENTS: The ventricles and sulci are normal. No intraparenchymal hemorrhage, mass effect nor midline shift. No acute large vascular territory infarcts. No abnormal extra-axial fluid collections. Basal cisterns are patent. No abnormal intracranial enhancement. ORBITS: The included ocular globes and orbital contents are normal. Old LEFT orbital floor fracture. SINUSES: Chronic severe LEFT maxillary sinusitis. SKULL/SOFT TISSUES: No skull fracture. No significant soft tissue swelling. CTA NECK Aortic arch: Normal appearance of the thoracic arch, normal branch pattern. The origins of the innominate, left Common carotid artery and subclavian artery are widely patent. Shoulder attenuation results in streak artifact through the lower neck. Right carotid system: Common carotid artery is widely patent, coursing in a straight line fashion. Normal appearance of the carotid bifurcation without hemodynamically significant stenosis by NASCET criteria. Normal appearance of the included internal carotid artery. Left carotid system: Common carotid artery is widely patent, coursing in a straight line fashion. Normal appearance of the carotid bifurcation without hemodynamically significant stenosis by NASCET  criteria. Normal appearance of the included internal carotid artery. Vertebral arteries:Left vertebral artery is dominant. Normal appearance of the vertebral arteries, which appear widely patent. Skeleton: No acute osseous process though bone windows have not been submitted.  Stable mild degenerative change of the cervical spine. Other neck: Soft tissues of the neck are non-acute though, not tailored for evaluation. Nuchal ligament calcifications. CTA HEAD Anterior circulation: Normal appearance of the cervical internal carotid arteries, petrous, cavernous and supra clinoid internal carotid arteries. Widely patent anterior communicating artery. Normal appearance of the anterior and middle cerebral arteries. Posterior circulation: Normal appearance of the vertebral arteries, vertebrobasilar junction and basilar artery, as well as main branch vessels. Normal appearance of the posterior cerebral arteries. No large vessel occlusion, hemodynamically significant stenosis, dissection, luminal irregularity, contrast extravasation or aneurysm within the anterior nor posterior circulation. IMPRESSION: Normal CT HEAD with and without contrast. Normal CTA HEAD and neck. Electronically Signed   By: Awilda Metroourtnay  Bloomer M.D.   On: 06/28/2015 02:10   I have personally reviewed and evaluated these images and lab results as part of my medical decision-making.   EKG Interpretation   Date/Time:  Thursday Jun 28 2015 00:37:16 EDT Ventricular Rate:  55 PR Interval:  128 QRS Duration: 104 QT Interval:  416 QTC Calculation: 398 R Axis:   82 Text Interpretation:  Sinus rhythm No acute ischemic changes or stigmata  of arrhythmia. Unchanged from prior EKG Confirmed by Jyrah Blye MD, Onyx Edgley [id number])  on 06/28/2015 1:02:32 AM      MDM   Final diagnoses:  Neck pain  Nonintractable headache, unspecified chronicity pattern, unspecified headache type  Midline thoracic back pain    40 year old male who presents with sudden on set of sharp tearing neck pain radiating to the head and upper back. VS stable on arrival. He is non-toxic and in no acute distress. Seems MSK pain as reproduced with movement and palpation of paraspinal muscles of neck and upper thoracic spine. Unclear etiology as no inciting  factors. Given nature of unprovoked pain, CTA neck obtained to eval cervical spine and evaluate for dissection. This is visualized and negative. CXR clear as well as basic blood work unremarkable. Low suspicion for other serious process. He will continue supportive care instructions for home. Strict return and follow-up instructions reviewed. He expressed understanding of all discharge instructions and felt comfortable with the plan of care.    I personally performed the services described in this documentation, which was scribed in my presence. The recorded information has been reviewed and is accurate.    Lavera Guiseana Duo Noor Witte, MD 06/28/15 [id number]

## 2015-06-28 NOTE — Discharge Instructions (Signed)
Your pain is likely related to muscle and bone pain. Your work up today does not show anything serious with neck, head and chest. Continue supportive management with ice packs/heat backs, OTC pain medications, and rest. Return for worsening symptoms, including fever, difficulty breathing, passing out, confusion, or any other symptoms concerning to you.  Musculoskeletal Pain Musculoskeletal pain is muscle and boney aches and pains. These pains can occur in any part of the body. Your caregiver may treat you without knowing the cause of the pain. They may treat you if blood or urine tests, X-rays, and other tests were normal.  CAUSES There is often not a definite cause or reason for these pains. These pains may be caused by a type of germ (virus). The discomfort may also come from overuse. Overuse includes working out too hard when your body is not fit. Boney aches also come from weather changes. Bone is sensitive to atmospheric pressure changes. HOME CARE INSTRUCTIONS   Ask when your test results will be ready. Make sure you get your test results.  Only take over-the-counter or prescription medicines for pain, discomfort, or fever as directed by your caregiver. If you were given medications for your condition, do not drive, operate machinery or power tools, or sign legal documents for 24 hours. Do not drink alcohol. Do not take sleeping pills or other medications that may interfere with treatment.  Continue all activities unless the activities cause more pain. When the pain lessens, slowly resume normal activities. Gradually increase the intensity and duration of the activities or exercise.  During periods of severe pain, bed rest may be helpful. Lay or sit in any position that is comfortable.  Putting ice on the injured area.  Put ice in a bag.  Place a towel between your skin and the bag.  Leave the ice on for 15 to 20 minutes, 3 to 4 times a day.  Follow up with your caregiver for continued  problems and no reason can be found for the pain. If the pain becomes worse or does not go away, it may be necessary to repeat tests or do additional testing. Your caregiver may need to look further for a possible cause. SEEK IMMEDIATE MEDICAL CARE IF:  You have pain that is getting worse and is not relieved by medications.  You develop chest pain that is associated with shortness or breath, sweating, feeling sick to your stomach (nauseous), or throw up (vomit).  Your pain becomes localized to the abdomen.  You develop any new symptoms that seem different or that concern you. MAKE SURE YOU:   Understand these instructions.  Will watch your condition.  Will get help right away if you are not doing well or get worse.   This information is not intended to replace advice given to you by your health care provider. Make sure you discuss any questions you have with your health care provider.   Document Released: 01/20/2005 Document Revised: 04/14/2011 Document Reviewed: 09/24/2012 Elsevier Interactive Patient Education [employer]2016 [employer] Inc[employer]

## 2015-06-28 NOTE — ED Notes (Signed)
MD at bedside. 

## 2016-01-28 ENCOUNTER — Encounter (HOSPITAL_COMMUNITY): Payer: Self-pay | Admitting: *Deleted

## 2016-01-28 ENCOUNTER — Emergency Department (HOSPITAL_COMMUNITY)
Admission: EM | Admit: 2016-01-28 | Discharge: 2016-01-28 | Disposition: A | Discharge status: Home / Self Care | Payer: Self-pay | Attending: Emergency Medicine | Admitting: Emergency Medicine

## 2016-01-28 DIAGNOSIS — S30861A Insect bite (nonvenomous) of abdominal wall, initial encounter: Secondary | ICD-10-CM | POA: Insufficient documentation

## 2016-01-28 DIAGNOSIS — Y939 Activity, unspecified: Secondary | ICD-10-CM | POA: Insufficient documentation

## 2016-01-28 DIAGNOSIS — W57XXXA Bitten or stung by nonvenomous insect and other nonvenomous arthropods, initial encounter: Secondary | ICD-10-CM | POA: Insufficient documentation

## 2016-01-28 DIAGNOSIS — Y929 Unspecified place or not applicable: Secondary | ICD-10-CM | POA: Insufficient documentation

## 2016-01-28 DIAGNOSIS — F17210 Nicotine dependence, cigarettes, uncomplicated: Secondary | ICD-10-CM | POA: Insufficient documentation

## 2016-01-28 DIAGNOSIS — Z79899 Other long term (current) drug therapy: Secondary | ICD-10-CM | POA: Insufficient documentation

## 2016-01-28 DIAGNOSIS — Y999 Unspecified external cause status: Secondary | ICD-10-CM | POA: Insufficient documentation

## 2016-01-28 MED ORDER — DOXYCYCLINE HYCLATE 100 MG PO CAPS: 100.0000 mg | ORAL_CAPSULE | Freq: Two times a day (BID) | ORAL | 0 refills | Status: DC

## 2016-01-28 NOTE — ED Triage Notes (Signed)
Tick bite to right lower abdomen

## 2016-01-28 NOTE — ED Provider Notes (Signed)
[facility]-EMERGENCY DEPT Provider Note   CSN: [id number] Arrival date & time: 01/28/16  1543     History   Chief Complaint Chief Complaint  Patient presents with  . Insect Bite    HPI Christian Schwartz is a 40 y.o. male.  HPI Patient presents to the emergency department with complaints of a tick embedded in his abdominal wall.  He states his been there for several days and he just really noticed it more today and had increasing pain.  His been unable to remove this at home and requests removal of the tick.  He's been cutting wood at home and this is where he thinks he was bit by a tick.  No fevers or chills.  He does report a small amount of abdominal wall redness.  No other complaints.    Past Medical History:  Diagnosis Date  . Chronic abdominal pain   . Chronic back pain    hx of breaking back at age 40 and 5 years ago fell through roof  . Epididymitis, right   . Vitamin D deficiency     Patient Active Problem List   Diagnosis Date Noted  . Vitamin D insufficiency 06/12/2011  . Weakness 05/12/2011  . Back pain 05/12/2011  . Abdominal pain 05/12/2011    Past Surgical History:  Procedure Laterality Date  . None         Home Medications    Prior to Admission medications   Medication Sig Start Date End Date Taking? Authorizing Provider  acetaminophen (TYLENOL 8 HOUR) 650 MG CR tablet Take 1 tablet (650 mg total) by mouth every 8 (eight) hours as needed for pain. 01/31/15   Derwood KaplanAnkit Nanavati, MD  acetaminophen-codeine (TYLENOL #3) 300-30 MG tablet Take 1-2 tablets by mouth every 6 (six) hours as needed for moderate pain. 12/31/14   Ivery QualeHobson Bryant, PA-C  clindamycin (CLEOCIN) 150 MG capsule Take 1 capsule (150 mg total) by mouth every 6 (six) hours. 01/31/15   Derwood KaplanAnkit Nanavati, MD  doxycycline (VIBRAMYCIN) 100 MG capsule Take 1 capsule (100 mg total) by mouth 2 (two) times daily. 01/28/16   Azalia BilisKevin Serenity Batley, MD  HYDROcodone-acetaminophen (NORCO/VICODIN) 5-325 MG per tablet Take 1  tablet by mouth every 4 (four) hours as needed. Patient not taking: Reported on 06/27/2014 12/26/13   Burgess AmorJulie Idol, PA-C  loperamide (IMODIUM) 2 MG capsule Take 1 capsule (2 mg total) by mouth 4 (four) times daily as needed for diarrhea or loose stools. 10/03/14   Shon Batonourtney F Horton, MD  Misc Natural Products (NF FORMULAS TESTOSTERONE PO) Take 1 tablet by mouth daily. AGELESS MALE-BOOST FREE TESTOSTERONE    Historical Provider, MD  oxyCODONE-acetaminophen (ROXICET) 5-325 MG per tablet Take 1 tablet by mouth every 4 (four) hours as needed for severe pain. Patient not taking: Reported on 12/26/2013 10/02/13   Janne NapoleonHope M Neese, NP    Family History Family History  Problem Relation Age of Onset  . Colon cancer      adopted, unsure    Social History Social History  Substance Use Topics  . Smoking status: Current Some Day Smoker    Packs/day: 0.50    Types: Cigarettes    Last attempt to quit: 12/31/2014  . Smokeless tobacco: Not on file  . Alcohol use No     Comment: former (since 5 years)     Allergies   Amoxicillin; Penicillins; Tramadol; Aspirin; Latex; Naproxen; Nsaids; Sulfa antibiotics; and Zofran [ondansetron hcl]   Review of Systems Review of Systems   Physical  Exam Updated Vital Signs BP 127/73 (BP Location: Left Arm)   Pulse 72   Temp 97.5 F (36.4 C) (Oral)   Resp 16   Ht 5\' 10"  (1.778 m)   Wt 165 lb (74.8 kg)   SpO2 98%   BMI 23.68 kg/m   Physical Exam  Constitutional: He is oriented to person, place, and time. He appears well-developed and well-nourished.  HENT:  Head: Normocephalic.  Eyes: EOM are normal.  Neck: Normal range of motion.  Pulmonary/Chest: Effort normal.  Abdominal:  Tick embedded in the abdominal wall in his right lower quadrant with small surrounding amount of erythema.  No fluctuance.  No drainage.  The tick is still moving.  Musculoskeletal: Normal range of motion.  Neurological: He is alert and oriented to person, place, and time.    Psychiatric: He has a normal mood and affect.  Nursing note and vitals reviewed.    ED Treatments / Results  Labs (all labs ordered are listed, but only abnormal results are displayed) Labs Reviewed - No data to display  EKG  EKG Interpretation None       Radiology No results found.  Procedures .Foreign Body Removal Performed by: Azalia BilisAMPOS, Keni Elison Authorized by: Azalia BilisAMPOS, Michal Strzelecki  Consent: Verbal consent obtained. Consent given by: patient Body area: skin General location: trunk Location details: abdomen Depth: subcutaneous Complexity: simple 1 objects recovered. Objects recovered: tick Post-procedure assessment: foreign body removed Patient tolerance: Patient tolerated the procedure well with no immediate complications   (including critical care time)  Medications Ordered in ED Medications - No data to display   Initial Impression / Assessment and Plan / ED Course  I have reviewed the triage vital signs and the nursing notes.  Pertinent labs & imaging results that were available during my care of the patient were reviewed by me and considered in my medical decision making (see chart for details).  Clinical Course     Tick removed without difficulty. Will place on doxy  Final Clinical Impressions(s) / ED Diagnoses   Final diagnoses:  Tick bite of abdomen, initial encounter    New Prescriptions New Prescriptions   DOXYCYCLINE (VIBRAMYCIN) 100 MG CAPSULE    Take 1 capsule (100 mg total) by mouth 2 (two) times daily.     Azalia BilisKevin Deseree Zemaitis, MD 01/28/16 [phone]

## 2016-01-28 NOTE — ED Notes (Signed)
Pt presents with c/o tick bite to right lower abd. States has been feeling bad x 2 days and found the tick approx 45 minutes ago. States he has been cutting wood x 2-3 days as well. Tick remains in place in his abd with redness noted around the site.

## 2016-03-26 ENCOUNTER — Emergency Department (HOSPITAL_COMMUNITY): Payer: Self-pay

## 2016-03-26 ENCOUNTER — Encounter (HOSPITAL_COMMUNITY): Payer: Self-pay | Admitting: Emergency Medicine

## 2016-03-26 ENCOUNTER — Emergency Department (HOSPITAL_COMMUNITY)
Admission: EM | Admit: 2016-03-26 | Discharge: 2016-03-26 | Disposition: A | Discharge status: Home / Self Care | Payer: Self-pay | Attending: Emergency Medicine | Admitting: Emergency Medicine

## 2016-03-26 DIAGNOSIS — F17210 Nicotine dependence, cigarettes, uncomplicated: Secondary | ICD-10-CM | POA: Insufficient documentation

## 2016-03-26 DIAGNOSIS — R079 Chest pain, unspecified: Secondary | ICD-10-CM | POA: Insufficient documentation

## 2016-03-26 DIAGNOSIS — K029 Dental caries, unspecified: Secondary | ICD-10-CM | POA: Insufficient documentation

## 2016-03-26 LAB — BASIC METABOLIC PANEL
Anion gap: 7 (ref 5–15)
BUN: 8 mg/dL (ref 6–20)
CO2: 30 mmol/L (ref 22–32)
CREATININE: 0.88 mg/dL (ref 0.61–1.24)
Calcium: 8.7 mg/dL — ABNORMAL LOW (ref 8.9–10.3)
Chloride: 101 mmol/L (ref 101–111)
Glucose, Bld: 102 mg/dL — ABNORMAL HIGH (ref 65–99)
Potassium: 3.7 mmol/L (ref 3.5–5.1)
SODIUM: 138 mmol/L (ref 135–145)

## 2016-03-26 LAB — I-STAT TROPONIN, ED: TROPONIN I, POC: 0 ng/mL (ref 0.00–0.08)

## 2016-03-26 LAB — CBC
HCT: 43.8 % (ref 39.0–52.0)
Hemoglobin: 14.7 g/dL (ref 13.0–17.0)
MCH: 32.6 pg (ref 26.0–34.0)
MCHC: 33.6 g/dL (ref 30.0–36.0)
MCV: 97.1 fL (ref 78.0–100.0)
PLATELETS: 208 10*3/uL (ref 150–400)
RBC: 4.51 MIL/uL (ref 4.22–5.81)
RDW: 13.3 % (ref 11.5–15.5)
WBC: 7.5 10*3/uL (ref 4.0–10.5)

## 2016-03-26 MED ORDER — ERYTHROMYCIN BASE 250 MG PO TABS: 250.0000 mg | ORAL_TABLET | Freq: Four times a day (QID) | ORAL | 0 refills | Status: DC

## 2016-03-26 MED ORDER — CLINDAMYCIN HCL 150 MG PO CAPS
300.0000 mg | ORAL_CAPSULE | Freq: Once | ORAL | Status: AC
  Administered 2016-03-26: 300 mg via ORAL
  Filled 2016-03-26: qty 2

## 2016-03-26 NOTE — ED Notes (Signed)
Pt ambulatory to waiting room. Pt verbalized understanding of discharge instructions.   

## 2016-03-26 NOTE — ED Provider Notes (Signed)
[facility]-EMERGENCY DEPT Provider Note   CSN: [id number] Arrival date & time: 03/26/16  2037  By signing my name below, I, Bing Neighbors., attest that this documentation has been prepared under the direction and in the presence of No att. providers found. Electronically signed: Bing Neighbors., ED Scribe. 03/27/16. 7:51 PM.    History   Chief Complaint Chief Complaint  Patient presents with  . Chest Pain    HPI Christian Schwartz is a 41 y.o. male who presents to the Emergency Department complaining of a worsening, chronic dental problem with sudden onset x1 day. Pt states that earlier today he had a weird feeling in his chest that he suspects is from a spreading gum infection. He reportedly feels like the "infection is spreading through his sinuses, neck and throughout his body." Pt reports productive cough, L chest pain. He denies any modifying factors. Pt denies fever. Of note, pt is a smoker.    The history is provided by the patient. No language interpreter was used.    Past Medical History:  Diagnosis Date  . Chronic abdominal pain   . Chronic back pain    hx of breaking back at age 19 and 5 years ago fell through roof  . Epididymitis, right   . Vitamin D deficiency     Patient Active Problem List   Diagnosis Date Noted  . Vitamin D insufficiency 06/12/2011  . Weakness 05/12/2011  . Back pain 05/12/2011  . Abdominal pain 05/12/2011    Past Surgical History:  Procedure Laterality Date  . None         Home Medications    Prior to Admission medications   Medication Sig Start Date End Date Taking? Authorizing Provider  oxyCODONE-acetaminophen (ROXICET) 5-325 MG per tablet Take 1 tablet by mouth every 4 (four) hours as needed for severe pain. 10/02/13  Yes Hope Orlene Och, NP  erythromycin (E-MYCIN) 250 MG tablet Take 1 tablet (250 mg total) by mouth 4 (four) times daily. 03/26/16   Mancel Bale, MD    Family History Family History  Problem Relation  Age of Onset  . Colon cancer      adopted, unsure    Social History Social History  Substance Use Topics  . Smoking status: Current Some Day Smoker    Packs/day: 0.50    Types: Cigarettes    Last attempt to quit: 12/31/2014  . Smokeless tobacco: Never Used  . Alcohol use No     Comment: former (since 5 years)     Allergies   Amoxicillin; Penicillins; Tramadol; Aspirin; Latex; Naproxen; Nsaids; Sulfa antibiotics; and Zofran [ondansetron hcl]   Review of Systems Review of Systems  Constitutional: Negative for fever.  HENT: Positive for dental problem.   Respiratory: Positive for cough.   Cardiovascular: Positive for chest pain.  All other systems reviewed and are negative.    Physical Exam Updated Vital Signs BP (!) 101/53   Pulse (!) 48   Temp 98.5 F (36.9 C) (Oral)   Resp 24   Ht 5\' 10"  (1.778 m)   Wt 165 lb (74.8 kg)   SpO2 96%   BMI 23.68 kg/m   Physical Exam  Constitutional: He is oriented to person, place, and time. He appears well-developed and well-nourished.  HENT:  Head: Normocephalic and atraumatic.  Right Ear: External ear normal.  Left Ear: External ear normal.  Mouth/Throat: No trismus in the jaw. Dental caries present.  Multiple cavities without swelling of the gumline.  Eyes: Conjunctivae and EOM are normal. Pupils are equal, round, and reactive to light.  Neck: Normal range of motion and phonation normal. Neck supple.  Cardiovascular: Normal rate, regular rhythm and normal heart sounds.   Pulmonary/Chest: Effort normal. He has decreased breath sounds. He has no wheezes. He has rhonchi. He has no rales. He exhibits no bony tenderness.  Decreased air flow bilaterally with few rhonchi. No wheezes or rales.   Abdominal: Soft. There is no tenderness.  Musculoskeletal: Normal range of motion.  Neurological: He is alert and oriented to person, place, and time. No cranial nerve deficit or sensory deficit. He exhibits normal muscle tone.  Coordination normal.  Skin: Skin is warm, dry and intact.  Psychiatric: He has a normal mood and affect. His behavior is normal. Judgment and thought content normal.  Nursing note and vitals reviewed.    ED Treatments / Results     DIAGNOSTIC STUDIES: Oxygen Saturation is 98% on RA, normal by my interpretation.   COORDINATION OF CARE: 7:51 PM-Discussed next steps with pt. Pt verbalized understanding and is agreeable with the plan.    Labs (all labs ordered are listed, but only abnormal results are displayed) Labs Reviewed  BASIC METABOLIC PANEL - Abnormal; Notable for the following:       Result Value   Glucose, Bld 102 (*)    Calcium 8.7 (*)    All other components within normal limits  CBC  I-STAT TROPOININ, ED    EKG  EKG Interpretation  Date/Time:  Wednesday March 26 2016 21:07:02 EST Ventricular Rate:  51 PR Interval:  142 QRS Duration: 107 QT Interval:  418 QTC Calculation: 385 R Axis:   87 Text Interpretation:  Sinus rhythm Since last tracing rate faster Confirmed by Effie ShyWENTZ  MD, Niraj Kudrna [phone]([id number]) on 03/27/2016 12:12:28 AM       Radiology Dg Chest 2 View  Result Date: 03/26/2016 CLINICAL DATA:  Left-sided chest pain with shortness of breath EXAM: CHEST  2 VIEW COMPARISON:  06/28/2015 FINDINGS: The heart size and mediastinal contours are within normal limits. Both lungs are clear. The visualized skeletal structures are unremarkable. IMPRESSION: No active cardiopulmonary disease. Electronically Signed   By: Jasmine PangKim  Fujinaga M.D.   On: 03/26/2016 22:08    Procedures Procedures (including critical care time)  Medications Ordered in ED Medications  clindamycin (CLEOCIN) capsule 300 mg (300 mg Oral Given 03/26/16 2233)     Initial Impression / Assessment and Plan / ED Course  I have reviewed the triage vital signs and the nursing notes.  Pertinent labs & imaging results that were available during my care of the patient were reviewed by me and considered in my  medical decision making (see chart for details).     Medications  clindamycin (CLEOCIN) capsule 300 mg (300 mg Oral Given 03/26/16 2233)    Patient Vitals for the past 24 hrs:  BP Temp Temp src Pulse Resp SpO2 Height Weight  03/26/16 2300 (!) 101/53 - - (!) 48 24 96 % - -  03/26/16 2049 - - - - - - 5\' 10"  (1.778 m) 165 lb (74.8 kg)  03/26/16 2048 124/66 98.5 F (36.9 C) Oral (!) 53 20 98 % - -    At D/c Reevaluation with update and discussion. After initial assessment and treatment, an updated evaluation reveals he requested a medication cheaper than Clindamycin. Questions answered. Konica Stankowski L    Final Clinical Impressions(s) / ED Diagnoses   Final diagnoses:  Pain due to dental caries  Recurrent dental pain with very poor dentition. No apparent abscess.  Nursing Notes Reviewed/ Care Coordinated Applicable Imaging Reviewed Interpretation of Laboratory Data incorporated into ED treatment  The patient appears reasonably screened and/or stabilized for discharge and I doubt any other medical condition or other [facility] requiring further screening, evaluation, or treatment in the ED at this time prior to discharge.  Plan: Home Medications- IBU for pain; Home Treatments- rest; return here if the recommended treatment, does not improve the symptoms; Recommended follow up- Dental care asap   New Prescriptions Discharge Medication List as of 03/26/2016 10:57 PM    START taking these medications   Details  erythromycin (E-MYCIN) 250 MG tablet Take 1 tablet (250 mg total) by mouth 4 (four) times daily., Starting Wed 03/26/2016, Print       I personally performed the services described in this documentation, which was scribed in my presence. The recorded information has been reviewed and is accurate.    Mancel Bale, MD 03/27/16 [phone]

## 2016-03-26 NOTE — Discharge Instructions (Signed)
Follow-up with a dentist, as soon as possible.

## 2016-03-26 NOTE — ED Triage Notes (Signed)
Pt c/o chest pain, headache and chest pain that started yesterday. Pt states it is all coming from infected gums.

## 2016-11-13 ENCOUNTER — Encounter (HOSPITAL_COMMUNITY): Payer: Self-pay

## 2016-11-13 ENCOUNTER — Emergency Department (HOSPITAL_COMMUNITY)
Admission: EM | Admit: 2016-11-13 | Discharge: 2016-11-13 | Disposition: A | Discharge status: Home / Self Care | Payer: Self-pay | Attending: Emergency Medicine | Admitting: Emergency Medicine

## 2016-11-13 DIAGNOSIS — F17210 Nicotine dependence, cigarettes, uncomplicated: Secondary | ICD-10-CM | POA: Insufficient documentation

## 2016-11-13 DIAGNOSIS — K0889 Other specified disorders of teeth and supporting structures: Secondary | ICD-10-CM | POA: Insufficient documentation

## 2016-11-13 DIAGNOSIS — K029 Dental caries, unspecified: Secondary | ICD-10-CM | POA: Insufficient documentation

## 2016-11-13 DIAGNOSIS — Z79899 Other long term (current) drug therapy: Secondary | ICD-10-CM | POA: Insufficient documentation

## 2016-11-13 MED ORDER — CLINDAMYCIN HCL 150 MG PO CAPS: 150.0000 mg | ORAL_CAPSULE | Freq: Four times a day (QID) | ORAL | 0 refills | Status: DC

## 2016-11-13 MED ORDER — IBUPROFEN 600 MG PO TABS: 600.0000 mg | ORAL_TABLET | Freq: Four times a day (QID) | ORAL | 0 refills | Status: DC | PRN

## 2016-11-13 MED ORDER — IBUPROFEN 400 MG PO TABS
600.0000 mg | ORAL_TABLET | Freq: Once | ORAL | Status: AC
  Administered 2016-11-13: 600 mg via ORAL
  Filled 2016-11-13: qty 2

## 2016-11-13 NOTE — ED Triage Notes (Signed)
Pt reports pain in his whole mouth from "bad teeth" x several months, taking ibuprofen at home for same and has also been taking oxycodone but has run out.

## 2016-11-13 NOTE — ED Provider Notes (Signed)
[facility]-EMERGENCY DEPT Provider Note   CSN: [id number] Arrival date & time: 11/13/16  0245     History   Chief Complaint Chief Complaint  Patient presents with  . Dental Pain    HPI HRIDAY STAI is a 41 y.o. male.  HPI  This 41 year old male who presents with dental pain and concern for infection. Patient reports history of chronic dental caries. He states that he has not gotten his Medicaid yet and has been unable to see a dentist. He reports one-week history of progressive worsening right upper mouth pain. Denies trismus or difficulty swallowing. Denies fevers. Current pain is rated 8 out of 10. He has taken ibuprofen at home with minimal relief.  Past Medical History:  Diagnosis Date  . Chronic abdominal pain   . Chronic back pain    hx of breaking back at age 41 and 5 years ago fell through roof  . Epididymitis, right   . Vitamin D deficiency     Patient Active Problem List   Diagnosis Date Noted  . Vitamin D insufficiency 06/12/2011  . Weakness 05/12/2011  . Back pain 05/12/2011  . Abdominal pain 05/12/2011    Past Surgical History:  Procedure Laterality Date  . None         Home Medications    Prior to Admission medications   Medication Sig Start Date End Date Taking? Authorizing Provider  clindamycin (CLEOCIN) 150 MG capsule Take 1 capsule (150 mg total) by mouth every 6 (six) hours. 11/13/16   Horton, Mayer Masker, MD  erythromycin (E-MYCIN) 250 MG tablet Take 1 tablet (250 mg total) by mouth 4 (four) times daily. 03/26/16   Mancel Bale, MD  ibuprofen (ADVIL,MOTRIN) 600 MG tablet Take 1 tablet (600 mg total) by mouth every 6 (six) hours as needed. 11/13/16   Horton, Mayer Masker, MD  oxyCODONE-acetaminophen (ROXICET) 5-325 MG per tablet Take 1 tablet by mouth every 4 (four) hours as needed for severe pain. 10/02/13   Janne Napoleon, NP    Family History Family History  Problem Relation Age of Onset  . Colon cancer Unknown        adopted, unsure     Social History Social History  Substance Use Topics  . Smoking status: Current Some Day Smoker    Packs/day: 0.50    Types: Cigarettes    Last attempt to quit: 12/31/2014  . Smokeless tobacco: Never Used  . Alcohol use No     Comment: former (since 5 years)     Allergies   Amoxicillin; Penicillins; Tramadol; Aspirin; Latex; Naproxen; Nsaids; Sulfa antibiotics; and Zofran [ondansetron hcl]   Review of Systems Review of Systems  Constitutional: Negative for fever.  HENT: Positive for dental problem. Negative for trouble swallowing.   All other systems reviewed and are negative.    Physical Exam Updated Vital Signs BP 114/71 (BP Location: Right Arm)   Pulse 60   Temp 98.4 F (36.9 C) (Oral)   Resp 16   Ht  (1.778 m)   Wt 83.5 kg (184 lb)   SpO2 96%   BMI 26.40 kg/m   Physical Exam  Constitutional: He is oriented to person, place, and time. He appears well-developed and well-nourished.  HENT:  Head: Normocephalic and atraumatic.  Generally poor dentition, multiple dental caries noted, no enters palpation along the gumline, no obvious abscess, no trismus, no fullness under the tongue  Cardiovascular: Normal rate and regular rhythm.   Pulmonary/Chest: Effort normal. No respiratory distress.  Neurological: He is alert and oriented to person, place, and time.  Skin: Skin is warm and dry.  Psychiatric: He has a normal mood and affect.  Nursing note and vitals reviewed.    ED Treatments / Results  Labs (all labs ordered are listed, but only abnormal results are displayed) Labs Reviewed - No data to display  EKG  EKG Interpretation None       Radiology No results found.  Procedures Procedures (including critical care time)  Medications Ordered in ED Medications  ibuprofen (ADVIL,MOTRIN) tablet 600 mg (not administered)     Initial Impression / Assessment and Plan / ED Course  I have reviewed the triage vital signs and the nursing  notes.  Pertinent labs & imaging results that were available during my care of the patient were reviewed by me and considered in my medical decision making (see chart for details).     Patient presents with pain related to dental caries. No obvious abscess. Generally poor dentition. No suspicion at this time for deep space infection. Recommend continued ibuprofen at home and clindamycin. Patient was given dental resources.  After history, exam, and medical workup I feel the patient has been appropriately medically screened and is safe for discharge home. Pertinent diagnoses were discussed with the patient. Patient was given return precautions.   Final Clinical Impressions(s) / ED Diagnoses   Final diagnoses:  Pain due to dental caries    New Prescriptions New Prescriptions   CLINDAMYCIN (CLEOCIN) 150 MG CAPSULE    Take 1 capsule (150 mg total) by mouth every 6 (six) hours.   IBUPROFEN (ADVIL,MOTRIN) 600 MG TABLET    Take 1 tablet (600 mg total) by mouth every 6 (six) hours as needed.     Shon Baton, MD 11/13/16 [phone]

## 2018-02-06 ENCOUNTER — Encounter (HOSPITAL_COMMUNITY): Payer: Self-pay | Admitting: Emergency Medicine

## 2018-02-06 ENCOUNTER — Other Ambulatory Visit: Payer: Self-pay

## 2018-02-06 ENCOUNTER — Emergency Department (HOSPITAL_COMMUNITY)
Admission: EM | Admit: 2018-02-06 | Discharge: 2018-02-06 | Disposition: A | Discharge status: Home / Self Care | Payer: Medicaid Other | Attending: Emergency Medicine | Admitting: Emergency Medicine

## 2018-02-06 ENCOUNTER — Emergency Department (HOSPITAL_COMMUNITY): Payer: Medicaid Other

## 2018-02-06 DIAGNOSIS — F17210 Nicotine dependence, cigarettes, uncomplicated: Secondary | ICD-10-CM | POA: Diagnosis not present

## 2018-02-06 DIAGNOSIS — S61217A Laceration without foreign body of left little finger without damage to nail, initial encounter: Secondary | ICD-10-CM | POA: Diagnosis not present

## 2018-02-06 DIAGNOSIS — Z91040 Latex allergy status: Secondary | ICD-10-CM | POA: Diagnosis not present

## 2018-02-06 DIAGNOSIS — Y929 Unspecified place or not applicable: Secondary | ICD-10-CM | POA: Insufficient documentation

## 2018-02-06 DIAGNOSIS — W260XXA Contact with knife, initial encounter: Secondary | ICD-10-CM | POA: Insufficient documentation

## 2018-02-06 DIAGNOSIS — Y9389 Activity, other specified: Secondary | ICD-10-CM | POA: Insufficient documentation

## 2018-02-06 DIAGNOSIS — Z23 Encounter for immunization: Secondary | ICD-10-CM | POA: Diagnosis not present

## 2018-02-06 DIAGNOSIS — Y999 Unspecified external cause status: Secondary | ICD-10-CM | POA: Diagnosis not present

## 2018-02-06 MED ORDER — POVIDONE-IODINE 10 % EX SOLN
CUTANEOUS | Status: DC | PRN
  Administered 2018-02-06: 1 via TOPICAL
  Filled 2018-02-06: qty 15

## 2018-02-06 MED ORDER — LIDOCAINE HCL (PF) 2 % IJ SOLN
INTRAMUSCULAR | Status: AC
  Filled 2018-02-06: qty 10

## 2018-02-06 MED ORDER — LIDOCAINE HCL (PF) 2 % IJ SOLN
5.0000 mL | Freq: Once | INTRAMUSCULAR | Status: AC
  Administered 2018-02-06: 5 mL via INTRADERMAL

## 2018-02-06 MED ORDER — HYDROCODONE-ACETAMINOPHEN 5-325 MG PO TABS
1.0000 | ORAL_TABLET | Freq: Once | ORAL | Status: AC
  Administered 2018-02-06: 1 via ORAL
  Filled 2018-02-06: qty 1

## 2018-02-06 MED ORDER — TETANUS-DIPHTH-ACELL PERTUSSIS 5-2.5-18.5 LF-MCG/0.5 IM SUSP
0.5000 mL | Freq: Once | INTRAMUSCULAR | Status: AC
  Administered 2018-02-06: 0.5 mL via INTRAMUSCULAR
  Filled 2018-02-06: qty 0.5

## 2018-02-06 NOTE — Discharge Instructions (Addendum)
Keep the wound clean with mild soap and water, keep it bandaged until the area heals.  You may use the finger splint as needed for a few days to prevent bending your finger.  Sutures out in 8 to 10 days.  Return to the ER for any signs of infection such as increasing pain, redness, swelling, or excessive drainage from the site.

## 2018-02-06 NOTE — ED Triage Notes (Signed)
Pt states he was opening a box when he cut his left pinky on an exacto knife.
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
## 2018-02-15 ENCOUNTER — Other Ambulatory Visit: Payer: Self-pay

## 2018-02-15 ENCOUNTER — Emergency Department (HOSPITAL_COMMUNITY)
Admission: EM | Admit: 2018-02-15 | Discharge: 2018-02-15 | Disposition: A | Discharge status: Home / Self Care | Payer: Medicaid Other | Attending: Emergency Medicine | Admitting: Emergency Medicine

## 2018-02-15 ENCOUNTER — Encounter (HOSPITAL_COMMUNITY): Payer: Self-pay | Admitting: Emergency Medicine

## 2018-02-15 DIAGNOSIS — F17210 Nicotine dependence, cigarettes, uncomplicated: Secondary | ICD-10-CM | POA: Diagnosis not present

## 2018-02-15 DIAGNOSIS — Z91040 Latex allergy status: Secondary | ICD-10-CM | POA: Insufficient documentation

## 2018-02-15 DIAGNOSIS — Z4802 Encounter for removal of sutures: Secondary | ICD-10-CM | POA: Diagnosis not present

## 2018-02-15 MED ORDER — CEPHALEXIN 500 MG PO CAPS: 1000.0000 mg | ORAL_CAPSULE | Freq: Four times a day (QID) | ORAL | 0 refills | Status: AC

## 2018-02-15 NOTE — ED Notes (Signed)
Pt ambulatory to waiting room. Pt verbalized understanding of discharge instructions.   

## 2018-02-15 NOTE — ED Triage Notes (Signed)
Pt reports stitches to L pinky on 02/06/2018 and needs them removed.

## 2018-02-16 NOTE — ED Provider Notes (Signed)
[facility] EMERGENCY DEPARTMENT Provider Note   CSN: [id number] Arrival date & time: 02/15/18  1959     History   Chief Complaint Chief Complaint  Patient presents with  . Suture / Staple Removal    HPI Christian Schwartz is a 43 y.o. male presenting for suture removal of his left fifth finger.  Sutures were placed on January 4 here, injury sustained by an X-Acto knife.  He does report increased erythema around the wound edges and states when he applied direct pressure a tiny bead of pus came from 1 of his suture holes.  He denies radiation of pain, spreading redness although the area around the finger has been edematous.  The history is provided by the patient.    Past Medical History:  Diagnosis Date  . Chronic abdominal pain   . Chronic back pain    hx of breaking back at age 32 and 5 years ago fell through roof  . Epididymitis, right   . Vitamin D deficiency     Patient Active Problem List   Diagnosis Date Noted  . Vitamin D insufficiency 06/12/2011  . Weakness 05/12/2011  . Back pain 05/12/2011  . Abdominal pain 05/12/2011    Past Surgical History:  Procedure Laterality Date  . None          Home Medications    Prior to Admission medications   Medication Sig Start Date End Date Taking? Authorizing Provider  cephALEXin (KEFLEX) 500 MG capsule Take 2 capsules (1,000 mg total) by mouth 4 (four) times daily for 7 days. 02/15/18 02/22/18  [name], PA-C    Family History Family History  Problem Relation Age of Onset  . Colon cancer Other        adopted, unsure    Social History Social History   Tobacco Use  . Smoking status: Current Some Day Smoker    Packs/day: 0.50    Types: Cigarettes    Last attempt to quit: 12/31/2014    Years since quitting: 3.1  . Smokeless tobacco: Never Used  Substance Use Topics  . Alcohol use: No    Comment: former (since 5 years)  . 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  Respiratory: Negative for shortness of breath and wheezing.   Skin: Positive for wound.  Neurological: Negative for numbness.     Physical Exam Updated Vital Signs BP 133/77 (BP Location: Right Arm)   Pulse 71   Temp (!) 97.5 F (36.4 C) (Temporal)   Resp 16   Wt 86.2 kg   SpO2 99%   BMI 26.50 kg/m   Physical Exam Constitutional:      Appearance: He is well-developed.  HENT:     Head: Normocephalic.  Cardiovascular:     Rate and Rhythm: Normal rate.  Pulmonary:     Effort: Pulmonary effort is normal.  Musculoskeletal:        General: No tenderness.  Skin:    Findings: Laceration present.     Comments: Approximated laceration left fifth finger volar.  There is mild erythema surrounding the suture site.  There is no fluctuance or drainage with palpation.  No red streaking.  Neurological:     Mental Status: He is alert and  oriented to person, place, and time.     Sensory: No sensory deficit.      ED Treatments / Results  Labs (all labs ordered are listed, but only abnormal results are displayed) Labs Reviewed - No data to display  EKG None  Radiology No results found.  Procedures Procedures (including critical care time)  SUTURE REMOVAL Performed by: [name]  Consent: Verbal consent obtained. Patient identity confirmed: provided demographic data Time out: Immediately prior to procedure a "time out" was called to verify the correct patient, procedure, equipment, support staff and site/side marked as required.  Location details: left 5th finger  Wound Appearance: clean but with mild erythema and edema.  Sutures/Staples Removed: Sutures were removed, #5.  There was slight dehiscence at the middle of the sutured wound.  There was  no purulent drainage.  Several Steri-Strips were applied for additional support of this wound as it continues to heal.  Facility: sutures placed in this facility Patient tolerance: Patient tolerated the procedure well with no immediate complications.     Medications Ordered in ED Medications - No data to display   Initial Impression / Assessment and Plan / ED Course  I have reviewed the triage vital signs and the nursing notes.  Pertinent labs & imaging results that were available during my care of the patient were reviewed by me and considered in my medical decision making (see chart for details).     There may be a mild superficial infection at this wound site given patient's history of having purulent drainage, although I suspect majority of the edema and erythema are localized inflammation secondary to the suturing itself.  However he was placed on antibiotics, he was given strict return precautions if symptoms persist or worsen.  Advised twice daily soap and water wash, followed by reapplication of a clean bandage.  PRN follow-up anticipated.  Final Clinical Impressions(s) / ED Diagnoses   Final diagnoses:  Visit for suture removal    ED Discharge Orders         Ordered    cephALEXin (KEFLEX) 500 MG capsule  4 times daily     02/15/18 2040           [name], PA-C 02/16/18 0154    Long, Arlyss Repress, MD 02/16/18 1006

## 2018-03-20 ENCOUNTER — Encounter (HOSPITAL_COMMUNITY): Payer: Self-pay

## 2018-03-20 ENCOUNTER — Emergency Department (HOSPITAL_COMMUNITY)
Admission: EM | Admit: 2018-03-20 | Discharge: 2018-03-20 | Disposition: A | Discharge status: Home / Self Care | Payer: Medicaid Other | Attending: Emergency Medicine | Admitting: Emergency Medicine

## 2018-03-20 ENCOUNTER — Other Ambulatory Visit: Payer: Self-pay

## 2018-03-20 DIAGNOSIS — M79645 Pain in left finger(s): Secondary | ICD-10-CM

## 2018-03-20 DIAGNOSIS — Z91040 Latex allergy status: Secondary | ICD-10-CM | POA: Diagnosis not present

## 2018-03-20 DIAGNOSIS — F17210 Nicotine dependence, cigarettes, uncomplicated: Secondary | ICD-10-CM | POA: Diagnosis not present

## 2018-03-20 LAB — CBC WITH DIFFERENTIAL/PLATELET
Abs Immature Granulocytes: 0.04 10*3/uL (ref 0.00–0.07)
BASOS ABS: 0.1 10*3/uL (ref 0.0–0.1)
Basophils Relative: 1 %
EOS ABS: 0.1 10*3/uL (ref 0.0–0.5)
EOS PCT: 1 %
HCT: 50 % (ref 39.0–52.0)
Hemoglobin: 16.2 g/dL (ref 13.0–17.0)
Immature Granulocytes: 0 %
LYMPHS ABS: 3.1 10*3/uL (ref 0.7–4.0)
LYMPHS PCT: 24 %
MCH: 31.5 pg (ref 26.0–34.0)
MCHC: 32.4 g/dL (ref 30.0–36.0)
MCV: 97.3 fL (ref 80.0–100.0)
Monocytes Absolute: 0.8 10*3/uL (ref 0.1–1.0)
Monocytes Relative: 6 %
NEUTROS PCT: 68 %
NRBC: 0 % (ref 0.0–0.2)
Neutro Abs: 9 10*3/uL — ABNORMAL HIGH (ref 1.7–7.7)
Platelets: 202 10*3/uL (ref 150–400)
RBC: 5.14 MIL/uL (ref 4.22–5.81)
RDW: 12.4 % (ref 11.5–15.5)
WBC: 13.1 10*3/uL — ABNORMAL HIGH (ref 4.0–10.5)

## 2018-03-20 LAB — BASIC METABOLIC PANEL
Anion gap: 10 (ref 5–15)
BUN: 12 mg/dL (ref 6–20)
CALCIUM: 9.2 mg/dL (ref 8.9–10.3)
CO2: 28 mmol/L (ref 22–32)
CREATININE: 0.9 mg/dL (ref 0.61–1.24)
Chloride: 98 mmol/L (ref 98–111)
GFR calc non Af Amer: >60 mL/min (ref >60)
Glucose, Bld: 112 mg/dL — ABNORMAL HIGH (ref 70–99)
Potassium: 3.7 mmol/L (ref 3.5–5.1)
SODIUM: 136 mmol/L (ref 135–145)

## 2018-03-20 NOTE — Discharge Instructions (Addendum)
Follow-up with the hand surgeon regarding possible nerve injury to your finger.  You will be called if your cultures grow anything and you need further treatment.  Return to the ED if you develop new or worsening symptoms.

## 2018-03-20 NOTE — ED Triage Notes (Signed)
Pt had a laceration on the palm side of his left 5th finger on 01/04. Had stiches placed and removed accordingly. Pt said that since then his finger has has a little less sensation when touched, tingles, and then said that it  Has been a little cooler also. On 2/14 noticed there was a red spec on the finger tip.

## 2018-03-20 NOTE — ED Notes (Signed)
Pt recently gave plasma

## 2018-03-20 NOTE — ED Provider Notes (Signed)
[facility] EMERGENCY DEPARTMENT Provider Note   CSN: [id number] Arrival date & time: 03/20/18  0325     History   Chief Complaint Chief Complaint  Patient presents with  . Hand Pain    L 5th finger     HPI Christian Schwartz is a 43 y.o. male.  Patient presents with burning pain and numbness to his fifth finger on his left hand.  States he sustained a laceration on January 4 from a knife.  He had this repaired in the ED.  He had sutures removed on January 13 and is placed on antibiotics for possibility of infection due to erythema.  He states he is had intermittent numbness and tingling in the distal phalanx of the finger since.  Over the past 1 week he is developed more burning pain in the distal phalanx with persistent numbness and tingling and sensitivity to touch.  He reports shooting pain in the tip of his finger that comes and goes but a constant numbness and tingling feeling that has been there since the cut.  Reports the pain has been burning and worsening and that is what brought him in today because he could not sleep.  He did not take any medication for it at home.  He is concerned about the possibility of another infection.  He denies any fevers, chills, nausea or vomiting. He denies any IV drug abuse.  He noticed a small red spot appeared on his tip of his finger yesterday.  The history is provided by the patient.  Hand Pain  Pertinent negatives include no chest pain, no abdominal pain, no headaches and no shortness of breath.    Past Medical History:  Diagnosis Date  . Chronic abdominal pain   . Chronic back pain    hx of breaking back at age 1 and 5 years ago fell through roof  . Epididymitis, right   . Vitamin D deficiency     Patient Active Problem List   Diagnosis Date Noted  . Vitamin D insufficiency 06/12/2011  . Weakness 05/12/2011  . Back pain 05/12/2011  . Abdominal pain 05/12/2011    Past Surgical History:  Procedure Laterality Date  . None           Home Medications    Prior to Admission medications   Not on File    Family History Family History  Problem Relation Age of Onset  . Colon cancer Other        adopted, unsure    Social History Social History   Tobacco Use  . Smoking status: Current Some Day Smoker    Packs/day: 0.50    Types: Cigarettes    Last attempt to quit: 12/31/2014    Years since quitting: 3.2  . Smokeless tobacco: Never Used  Substance Use Topics  . Alcohol use: No    Comment: former (since 5 years)  . 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  HENT: Negative for congestion and rhinorrhea.   Respiratory: Negative for cough and shortness of breath.   Cardiovascular: Negative for chest pain.  Gastrointestinal: Negative for abdominal pain, nausea and vomiting.  Genitourinary: Negative for dysuria and hematuria.  Musculoskeletal: Positive for arthralgias and myalgias.  Skin: Positive for wound.  Neurological: Positive for numbness. Negative for dizziness, weakness and headaches.   all other systems are negative except as noted in the HPI and PMH.     Physical Exam Updated Vital Signs BP 118/60   Pulse 74   Temp 97.8 F (36.6 C) (Oral)   Resp 18   Wt 86.2 kg   SpO2 96%   BMI 26.50 kg/m   Physical Exam Vitals signs and nursing note reviewed.  Constitutional:      General: He is not in acute distress.    Appearance: He is well-developed.  HENT:     Head: Normocephalic and atraumatic.     Mouth/Throat:     Pharynx: No oropharyngeal exudate.  Eyes:     Conjunctiva/sclera: Conjunctivae normal.     Pupils: Pupils are equal, round, and reactive to light.  Neck:     Musculoskeletal: Normal range  of motion and neck supple.     Comments: No meningismus. Cardiovascular:     Rate and Rhythm: Normal rate and regular rhythm.     Heart sounds: Normal heart sounds. No murmur.  Pulmonary:     Effort: Pulmonary effort is normal. No respiratory distress.     Breath sounds: Normal breath sounds.  Abdominal:     Palpations: Abdomen is soft.     Tenderness: There is no abdominal tenderness. There is no guarding or rebound.  Musculoskeletal: Normal range of motion.        General: No tenderness.     Comments: Left small finger with well-healed laceration on palmar surface near the IP joint.  Full range of motion of DIP, PIP, MCP. Decreased sensation of distal phalanx objectively. Small speck of erythema at fingertip.  No splinter hemorrhages  Pinpoint area of macular erythema at the pad of fifth digit distally No other lesions of palms or soles.  Skin:    General: Skin is warm.     Capillary Refill: Capillary refill takes less than 2 seconds.     Findings: Erythema and lesion present.  Neurological:     General: No focal deficit present.     Mental Status: He is alert and oriented to person, place, and time. Mental status is at baseline.     Cranial Nerves: No cranial nerve deficit.     Motor: No abnormal muscle tone.     Coordination: Coordination normal.     Comments: No ataxia on finger to nose bilaterally. No pronator drift. 5/5 strength throughout. CN 2-12 intact.Equal grip strength. Sensation intact.   Psychiatric:        Behavior: Behavior normal.      ED Treatments / Results  Labs (all labs ordered are listed, but only abnormal results are displayed) Labs Reviewed  CBC WITH DIFFERENTIAL/PLATELET - Abnormal; Notable for the following components:      Result Value   WBC 13.1 (*)    Neutro Abs 9.0 (*)    All other components within normal limits  BASIC METABOLIC PANEL - Abnormal; Notable for the following components:   Glucose, Bld 112 (*)    All other components  within normal limits  CULTURE, BLOOD (ROUTINE X 2)  CULTURE, BLOOD (ROUTINE X 2)    EKG None  Radiology No results found.  Procedures Procedures (including critical care time)  Medications Ordered in ED Medications - No data to display   Initial Impression / Assessment and Plan / ED Course  I have reviewed the triage vital signs and the nursing  notes.  Pertinent labs & imaging results that were available during my care of the patient were reviewed by me and considered in my medical decision making (see chart for details).    Burning pain with numbness and tingling in left fifth finger.  Sustained laceration to this finger 6 weeks ago.  No fevers, chills, nausea or vomiting.  Discussed with patient possibility of possible nerve injury.  There is no evidence of ongoing infection. Patient does have speck of erythema at fingertip but this seems atypical for Janeway lesion and patient has no endocarditis risk factors.  No fever or murmur.  No IV drug abuse. Will send blood cultures  Will refer patient to hand surgery for follow-up and further discussion of possibility of nerve injury of this finger. Return precautions discussed.   Final Clinical Impressions(s) / ED Diagnoses   Final diagnoses:  Finger pain, left    ED Discharge Orders    None       Nusayba Cadenas, Jeannett Senior, MD 03/20/18 [phone]

## 2018-03-25 LAB — CULTURE, BLOOD (ROUTINE X 2)
CULTURE: NO GROWTH
Culture: NO GROWTH
SPECIAL REQUESTS: ADEQUATE
SPECIAL REQUESTS: ADEQUATE

## 2018-11-24 ENCOUNTER — Other Ambulatory Visit: Payer: Self-pay

## 2018-11-24 ENCOUNTER — Encounter (HOSPITAL_COMMUNITY): Payer: Self-pay | Admitting: Emergency Medicine

## 2018-11-24 DIAGNOSIS — S20212A Contusion of left front wall of thorax, initial encounter: Secondary | ICD-10-CM | POA: Insufficient documentation

## 2018-11-24 DIAGNOSIS — Y92009 Unspecified place in unspecified non-institutional (private) residence as the place of occurrence of the external cause: Secondary | ICD-10-CM | POA: Insufficient documentation

## 2018-11-24 DIAGNOSIS — Y999 Unspecified external cause status: Secondary | ICD-10-CM | POA: Diagnosis not present

## 2018-11-24 DIAGNOSIS — S4992XA Unspecified injury of left shoulder and upper arm, initial encounter: Secondary | ICD-10-CM | POA: Diagnosis present

## 2018-11-24 DIAGNOSIS — M542 Cervicalgia: Secondary | ICD-10-CM | POA: Diagnosis not present

## 2018-11-24 DIAGNOSIS — W109XXA Fall (on) (from) unspecified stairs and steps, initial encounter: Secondary | ICD-10-CM | POA: Diagnosis not present

## 2018-11-24 DIAGNOSIS — Z91040 Latex allergy status: Secondary | ICD-10-CM | POA: Insufficient documentation

## 2018-11-24 DIAGNOSIS — Y9389 Activity, other specified: Secondary | ICD-10-CM | POA: Diagnosis not present

## 2018-11-24 DIAGNOSIS — F17210 Nicotine dependence, cigarettes, uncomplicated: Secondary | ICD-10-CM | POA: Diagnosis not present

## 2018-11-24 NOTE — ED Triage Notes (Signed)
Pt states he was walking up steps at home and railing gave away. Pt c/o neck and left collarbone/shoulder pain.

## 2018-11-25 ENCOUNTER — Emergency Department (HOSPITAL_COMMUNITY): Payer: Medicaid Other

## 2018-11-25 ENCOUNTER — Emergency Department (HOSPITAL_COMMUNITY)
Admission: EM | Admit: 2018-11-25 | Discharge: 2018-11-25 | Disposition: A | Discharge status: Home / Self Care | Payer: Medicaid Other | Attending: Emergency Medicine | Admitting: Emergency Medicine

## 2018-11-25 DIAGNOSIS — M542 Cervicalgia: Secondary | ICD-10-CM

## 2018-11-25 DIAGNOSIS — S20212A Contusion of left front wall of thorax, initial encounter: Secondary | ICD-10-CM

## 2018-11-25 DIAGNOSIS — W108XXA Fall (on) (from) other stairs and steps, initial encounter: Secondary | ICD-10-CM

## 2018-11-25 MED ORDER — CYCLOBENZAPRINE HCL 5 MG PO TABS: 5.0000 mg | ORAL_TABLET | Freq: Three times a day (TID) | ORAL | 0 refills | Status: AC | PRN

## 2018-11-25 MED ORDER — ACETAMINOPHEN 325 MG PO TABS
650.0000 mg | ORAL_TABLET | Freq: Once | ORAL | Status: AC
  Administered 2018-11-25: 03:00:00 650 mg via ORAL
  Filled 2018-11-25: qty 2

## 2018-11-25 MED ORDER — CYCLOBENZAPRINE HCL 10 MG PO TABS
5.0000 mg | ORAL_TABLET | Freq: Once | ORAL | Status: AC
  Administered 2018-11-25: 04:00:00 5 mg via ORAL
  Filled 2018-11-25: qty 1

## 2018-11-25 NOTE — Discharge Instructions (Signed)
Use ice and heat for comfort.  Take acetaminophen 650 mg every 6 hours for pain.  Take the Flexeril for your neck muscle pain.  Recheck if you are not improving over the next 7 to 10 days.

## 2018-11-25 NOTE — ED Provider Notes (Signed)
[facility] EMERGENCY DEPARTMENT Provider Note   CSN: [id number] Arrival date & time: 11/24/18  2115   Time seen 2:35 AM  History   Chief Complaint Chief Complaint  Patient presents with   Fall    HPI Christian Schwartz is a 43 y.o. male.     HPI patient states they moved into a new house and he was carrying some bags in one hand and he grabbed the railing of the outside steps and the railing broke loose and he fell and landed on the railing.  They state that there was no indication there was a problem with the railing before.  He states this happened around 8:55 PM tonight.  He did not hit his head or have loss of consciousness.  He complains of pain in his left clavicle and in his neck.  He is right-handed.  He has a small abrasion on his right knee but states he is not concerned about that it is not very painful.  He denies hitting his head of loss of consciousness, he denies nausea, vomiting, new numbness or tingling his extremities.  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  PCP Patient, No Pcp Per   Past Medical History:  Diagnosis Date   Chronic abdominal pain    Chronic back pain    hx of breaking back at age 33 and 5 years ago fell through roof   Epididymitis, right    Vitamin D deficiency     Patient Active Problem List   Diagnosis Date Noted   Vitamin D insufficiency 06/12/2011   Weakness 05/12/2011   Back pain 05/12/2011   Abdominal pain 05/12/2011    Past Surgical History:  Procedure Laterality Date   None          Home Medications    Prior to Admission medications   Medication Sig Start Date End Date Taking? Authorizing Provider  cyclobenzaprine (FLEXERIL) 5 MG tablet Take 1 tablet (5 mg total) by mouth 3 (three) times daily as needed. 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    Physical Exam Updated Vital Signs BP 115/69    Pulse 67    Temp 98.4 F (36.9 C)    Resp 17    Ht 5\' 10"  (1.778 m)    SpO2 98%    BMI 27.27 kg/m   Physical Exam Vitals signs and nursing note reviewed.  Constitutional:      Appearance: Normal appearance. He is normal weight.     Comments: Patient is very graphically moving his arms around to show me how  he fell and what he was doing.  However when I examined him it then hurts if I touch his clavicle.  HENT:     Head: Normocephalic and atraumatic.     Right Ear: External ear normal.     Nose: Nose normal.  Eyes:     Extraocular Movements: Extraocular movements intact.     Conjunctiva/sclera: Conjunctivae normal.     Pupils: Pupils are equal, round, and reactive to light.  Neck:      Comments: Patient has a cervical collar on, he complains of pain in his lower cervical spine and it goes in to the left paraspinous muscles. Cardiovascular:     Rate and Rhythm: Normal rate.  Pulmonary:     Effort: Pulmonary effort is normal. No respiratory distress.     Comments: Patient complains of pain in his left upper chest wall.  There is no bruising or swelling seen.  He has some deformity around his medial clavicle however there is not a lot of swelling to me think this is from an old injury.  Again he very graphically moves his arms around when he  is describing the events of the evening however when I examine him he then is extremely painful. Chest:     Chest wall: Tenderness present.    Musculoskeletal: Normal range of motion.     Comments: Patient moves his left arm freely and does not appear to have any discomfort in his left shoulder.  Skin:    General: Skin is warm and dry.     Findings: No bruising or rash.  Neurological:     General: No focal deficit present.     Mental Status: He is alert and oriented to person, place, and time.     Cranial Nerves: No cranial nerve deficit.  Psychiatric:        Mood and Affect: Mood normal.        Behavior: Behavior normal.        Thought Content: Thought content normal.      ED Treatments / Results  Labs (all labs ordered are listed, but only abnormal results are displayed) Labs Reviewed - No data to display  EKG None  Radiology Dg Ribs Unilateral W/chest Left  Result Date: 11/25/2018 CLINICAL DATA:  Fall, left-sided chest pain EXAM: LEFT RIBS AND CHEST - 3+ VIEW COMPARISON:  None. FINDINGS: No fracture or other bone lesions are seen involving the ribs. There is no evidence of pneumothorax or pleural effusion. Both lungs are clear. Heart size and mediastinal contours are within normal limits. IMPRESSION: Negative. Electronically Signed   By: Jonna ClarkBindu  Avutu M.D.   On: 11/25/2018 03:57   Dg Cervical Spine Complete  Result Date: 11/25/2018 CLINICAL DATA:  Fall EXAM: CERVICAL SPINE - COMPLETE 4+ VIEW COMPARISON:  None. FINDINGS: There is no evidence of cervical spine fracture or prevertebral soft tissue swelling. Alignment is normal. No other significant bone abnormalities are identified. Disc height loss with small anterior osteophytes are noted at C5-C6 and C6-C7. IMPRESSION: No acute fracture or malalignment. Electronically Signed   By: Jonna ClarkBindu  Avutu M.D.   On: 11/25/2018 03:55   Dg Clavicle Left  Result Date: 11/25/2018 CLINICAL DATA:  Fall EXAM: LEFT CLAVICLE - 2+ VIEWS  COMPARISON:  None. FINDINGS: There is no evidence of fracture or other focal bone lesions. Soft tissues are unremarkable. IMPRESSION: Negative. Electronically Signed   By: Jonna ClarkBindu  Avutu M.D.   On: 11/25/2018 03:54    Procedures Procedures (including critical care  time)  Medications Ordered in ED Medications  acetaminophen (TYLENOL) tablet 650 mg (650 mg Oral Given 11/25/18 0314)     Initial Impression / Assessment and Plan / ED Course  I have reviewed the triage vital signs and the nursing notes.  Pertinent labs & imaging results that were available during my care of the patient were reviewed by me and considered in my medical decision making (see chart for details).        Patient has multiple allergies.  He was given acetaminophen for pain.  X-rays were obtained of the areas he states hurts the most.  Patient's x-rays are without acute findings.  He was discharged home to take acetaminophen and Flexeril for his discomfort.  He can follow-up with orthopedics if he is not improving over the next week to 10 days.  Final Clinical Impressions(s) / ED Diagnoses   Final diagnoses:  Fall on steps, initial encounter  Contusion of left chest wall, initial encounter  Neck pain    ED Discharge Orders         Ordered    cyclobenzaprine (FLEXERIL) 5 MG tablet  3 times daily PRN     11/25/18 0402        OTC acetaminophen  Plan discharge  Devoria Albe, MD, Concha Pyo, MD 11/25/18 [phone]
